# Patient Record
Sex: Male | Born: 1990 | Race: White | Hispanic: No | Marital: Single | Smoking: Current some day smoker
Health system: Southern US, Community
[De-identification: ages and names within clinical notes are randomized; demographics above are authoritative.]

## PROBLEM LIST (undated history)

## (undated) DIAGNOSIS — S14105A Unspecified injury at C5 level of cervical spinal cord, initial encounter: Secondary | ICD-10-CM

## (undated) HISTORY — DX: Unspecified injury at C5 level of cervical spinal cord, initial encounter: S14.105A

---
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
## 2008-04-11 ENCOUNTER — Emergency Department (HOSPITAL_COMMUNITY): Admission: EM | Admit: 2008-04-11 | Discharge: 2008-04-11 | Payer: Self-pay | Admitting: Emergency Medicine

## 2014-07-02 ENCOUNTER — Ambulatory Visit: Payer: 59 | Admitting: Occupational Therapy

## 2014-07-02 ENCOUNTER — Ambulatory Visit: Payer: 59 | Attending: Physical Medicine and Rehabilitation

## 2014-07-02 DIAGNOSIS — M6281 Muscle weakness (generalized): Secondary | ICD-10-CM | POA: Diagnosis not present

## 2014-07-02 DIAGNOSIS — IMO0001 Reserved for inherently not codable concepts without codable children: Secondary | ICD-10-CM | POA: Diagnosis not present

## 2014-07-02 DIAGNOSIS — IMO0002 Reserved for concepts with insufficient information to code with codable children: Secondary | ICD-10-CM | POA: Diagnosis not present

## 2014-07-02 DIAGNOSIS — R279 Unspecified lack of coordination: Secondary | ICD-10-CM | POA: Diagnosis not present

## 2014-07-10 ENCOUNTER — Ambulatory Visit: Payer: 59

## 2014-07-13 ENCOUNTER — Ambulatory Visit: Payer: 59

## 2014-07-18 ENCOUNTER — Ambulatory Visit: Payer: 59

## 2014-07-18 ENCOUNTER — Encounter: Payer: 59 | Admitting: Occupational Therapy

## 2014-07-19 ENCOUNTER — Ambulatory Visit: Payer: 59

## 2014-07-19 ENCOUNTER — Encounter: Payer: 59 | Admitting: Occupational Therapy

## 2014-07-23 ENCOUNTER — Ambulatory Visit: Payer: 59

## 2014-07-23 ENCOUNTER — Encounter: Payer: 59 | Admitting: Occupational Therapy

## 2014-07-25 ENCOUNTER — Encounter: Payer: 59 | Admitting: Occupational Therapy

## 2014-07-25 ENCOUNTER — Ambulatory Visit: Payer: 59

## 2014-07-30 ENCOUNTER — Encounter: Payer: 59 | Admitting: Occupational Therapy

## 2014-07-30 ENCOUNTER — Ambulatory Visit: Payer: 59

## 2014-08-02 ENCOUNTER — Encounter: Payer: 59 | Admitting: Occupational Therapy

## 2014-08-02 ENCOUNTER — Ambulatory Visit: Payer: 59

## 2014-09-17 ENCOUNTER — Ambulatory Visit: Payer: 59 | Admitting: Podiatry

## 2014-09-17 ENCOUNTER — Encounter: Payer: Self-pay | Admitting: Podiatry

## 2014-09-17 VITALS — BP 132/88 | HR 70 | Resp 16 | Ht 67.0 in | Wt 170.0 lb

## 2014-09-17 DIAGNOSIS — L03031 Cellulitis of right toe: Secondary | ICD-10-CM
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
## 2014-09-17 NOTE — Patient Instructions (Signed)
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
## 2014-09-19 ENCOUNTER — Telehealth: Payer: Self-pay | Admitting: *Deleted

## 2014-09-19 ENCOUNTER — Encounter: Payer: Self-pay | Admitting: Podiatry

## 2014-09-19 ENCOUNTER — Ambulatory Visit (INDEPENDENT_AMBULATORY_CARE_PROVIDER_SITE_OTHER): Payer: 59 | Admitting: Podiatry

## 2014-09-19 VITALS — BP 112/68 | HR 74 | Temp 96.5°F | Resp 17

## 2014-09-19 DIAGNOSIS — L03031 Cellulitis of right toe: Secondary | ICD-10-CM

## 2014-09-19 MED ORDER — CEPHALEXIN 500 MG PO CAPS: 500.0000 mg | ORAL_CAPSULE | Freq: Three times a day (TID) | ORAL | Status: AC

## 2014-09-19 NOTE — Telephone Encounter (Signed)
I just need to talk to someone.  He had his toenail clipped down out of an infected toe on Monday.  It looks really bad, it's really swollen, black and blue.  I just want to check with someone because he is a spinal cord injury patient.  I'm not so sure that maybe he needs to be on an antibiotic.  I've got some pictures to send over so you guys can determine if he needs to be seen.  Both his toes also have blisters on them.  I don't know if that was from the freezing or the shots.  Someone please give me a call.  Durene CalHunter is here if you need to talk to him.  Patient's mom had called back and I asked Shanda BumpsJessica S to schedule him an appointment for today.

## 2014-09-19 NOTE — Progress Notes (Signed)
Subjective:     Patient ID: Gregory Deleon, male   DOB: [birth year], 23 y.o.   MRN: [id number]  HPIpatient presents with his parents stating that his right toe has been losing and he has blisters on both big toes and they were just concerned after the ingrown toenail removal. My previous note did have it reflected wrong as the right toe was the one we did the paronychia and the left is the one we did the permanent procedure on   Review of Systems     Objective:   Physical Exam Neurovascular status intact and unchanged with redness in the lateral border right hallux where we remove necrotic tissue secondary to paronychia infection and mild redness in the forefoot that's localized with no other drainage noted or other since of infection. 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 He will check his temperature daily

## 2014-09-24 ENCOUNTER — Encounter: Payer: Self-pay | Admitting: Podiatry

## 2014-09-24 ENCOUNTER — Ambulatory Visit (INDEPENDENT_AMBULATORY_CARE_PROVIDER_SITE_OTHER): Payer: 59

## 2014-09-24 ENCOUNTER — Ambulatory Visit (INDEPENDENT_AMBULATORY_CARE_PROVIDER_SITE_OTHER): Payer: 59 | Admitting: Podiatry

## 2014-09-24 VITALS — BP 141/88 | HR 75 | Resp 16

## 2014-09-24 DIAGNOSIS — R52 Pain, unspecified: Secondary | ICD-10-CM

## 2014-09-24 DIAGNOSIS — L03031 Cellulitis of right toe: Secondary | ICD-10-CM

## 2014-09-24 MED ORDER — LEVOFLOXACIN 500 MG PO TABS: 500.0000 mg | ORAL_TABLET | Freq: Every day | ORAL | Status: AC

## 2014-09-24 MED ORDER — SULFAMETHOXAZOLE-TRIMETHOPRIM 800-160 MG PO TABS
1.0000 | ORAL_TABLET | Freq: Two times a day (BID) | ORAL | Status: AC
Start: 2014-09-24 — End: ?

## 2014-09-25 NOTE — Progress Notes (Signed)
Subjective:     Patient ID: Gregory Deleon, male   DOB: 12-09-1990, 23 y.o.   MRN: [id number]  HPIpatient presents with parents stating that he still having trouble with his right big toe and he's had no fever or any indications of systemic infection   Review of Systems     Objective:   Physical Exam Neurovascular status unchanged with patient who is in a wheelchair and is noted to have drainage right hallux lateral border after having contracted infection is home for which I just cleaned the corner out to try to reduce drainage that was present. It is localized with no proximal edema erythema drainage noted currently    Assessment:     Continued paronychia infection with young man who is in a wheelchair with venous congestion which may be creating pressure on the site. continue to wear surgical shoe    Plan:     Reviewed condition and at this time I switched him over to Levaquin and Augmentin and advised on elevation to try to create more drainage of venous fluid and I also x-rayed which did indicate some soft tissue inflammation but did not indicate any current bone involvement as far as infection. He will be seen back in a week and was given strict instructions to let us know if any other changes should occur

## 2014-10-01 ENCOUNTER — Encounter: Payer: Self-pay | Admitting: Podiatry

## 2014-10-01 ENCOUNTER — Ambulatory Visit (INDEPENDENT_AMBULATORY_CARE_PROVIDER_SITE_OTHER): Payer: 59 | Admitting: Podiatry

## 2014-10-01 DIAGNOSIS — L03031 Cellulitis of right toe: Secondary | ICD-10-CM

## 2014-10-01 MED ORDER — LEVOFLOXACIN 500 MG PO TABS: 500.0000 mg | ORAL_TABLET | Freq: Every day | ORAL | Status: AC

## 2014-10-01 NOTE — Progress Notes (Signed)
Subjective:     Patient ID: Gregory Deleon, male   DOB: July 16, 1991, 23 y.o.   MRN: [id number]  HPI patient presents stating it seems to be doing better but I still have some crusted appearance on my right big toe   Review of Systems     Objective:   Physical Exam Neurovascular status intact with diminished erythema noted of the forefoot bilateral and blisters which of healed on both feet. There is some crusted tissue on the lateral side of the right big toe but it is localized with no proximal edema erythema or drainage noted and show signs of systemic infection    Assessment:     Doing well with gradual healing and what appeared to be a possible Betadine reaction which seems to have settled down with continued healing to occur of the right hallux lateral border where he had had a bad paronychia prior to see me    Plan:     He seems to be improving and everything looks better from a physical standpoint but I've advised on the continuation of soaks air dry and

## 2014-11-12 ENCOUNTER — Ambulatory Visit (INDEPENDENT_AMBULATORY_CARE_PROVIDER_SITE_OTHER): Payer: 59 | Admitting: Podiatry

## 2014-11-12 VITALS — BP 112/68 | HR 87 | Resp 16

## 2014-11-12 DIAGNOSIS — L600 Ingrowing nail: Secondary | ICD-10-CM

## 2014-11-14 NOTE — Progress Notes (Signed)
Subjective:     Patient ID: Gregory Deleon, male   DOB: May 12, 1991, 24 y.o.   MRN: [id number]  HPI patient presents with father stating I want to get my nail checked to make sure there is no current infection   Review of Systems     Objective:   Physical Exam Neurovascular status unchanged with patient in a wheelchair and found to have healing hallux nails right and left with slight discoloration still noted right hallux but no active drainage noted or indications of proximal edema erythema    Assessment:     Patient is doing well with ingrown toenail deformity which is healing with no indication of active infection    Plan:     Reviewed continued soaks and this should heal uneventfully and if any drainage were to start redness or other change to let us know immediately

## 2015-06-25 ENCOUNTER — Ambulatory Visit: Payer: 59 | Attending: Internal Medicine | Admitting: Physical Therapy

## 2015-06-25 DIAGNOSIS — R293 Abnormal posture: Secondary | ICD-10-CM | POA: Diagnosis not present

## 2015-06-26 ENCOUNTER — Encounter: Payer: Self-pay | Admitting: Physical Therapy
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
## 2015-12-25 ENCOUNTER — Ambulatory Visit: Payer: 59 | Admitting: Occupational Therapy

## 2015-12-25 ENCOUNTER — Ambulatory Visit: Payer: 59 | Attending: Internal Medicine | Admitting: Physical Therapy

## 2015-12-25 ENCOUNTER — Encounter: Payer: Self-pay | Admitting: Occupational Therapy

## 2015-12-25 ENCOUNTER — Encounter: Payer: Self-pay | Admitting: Physical Therapy

## 2015-12-25 DIAGNOSIS — R293 Abnormal posture: Secondary | ICD-10-CM | POA: Insufficient documentation

## 2015-12-25 DIAGNOSIS — Z741 Need for assistance with personal care: Secondary | ICD-10-CM

## 2015-12-25 DIAGNOSIS — R531 Weakness: Secondary | ICD-10-CM | POA: Insufficient documentation

## 2015-12-25 DIAGNOSIS — R2689 Other abnormalities of gait and mobility: Secondary | ICD-10-CM | POA: Diagnosis present

## 2015-12-25 DIAGNOSIS — R279 Unspecified lack of coordination: Secondary | ICD-10-CM | POA: Insufficient documentation

## 2015-12-25 DIAGNOSIS — R460 Very low level of personal hygiene: Secondary | ICD-10-CM | POA: Diagnosis present

## 2015-12-25 DIAGNOSIS — R278 Other lack of coordination: Secondary | ICD-10-CM | POA: Diagnosis present

## 2015-12-26 ENCOUNTER — Encounter: Payer: Self-pay | Admitting: Physical Therapy

## 2015-12-26 NOTE — Therapy (Signed)
[place] [facility] MAIN [facility] SERVICES [address] [place], [place], [address] Phone: [phone]   Fax:  [phone]  Physical Therapy Evaluation  Patient Details  Name: Gregory Deleon MRN: [id number] Date of Birth: 03-20-1991 Referring Provider: Martha Clan MD  Encounter Date: 12/25/2015      PT End of Session - 12/26/15 0920    Visit Number 1   Number of Visits 17   Date for PT Re-Evaluation 02/20/16   PT Start Time 0830   PT Stop Time 0920   PT Time Calculation (min) 50 min   Activity Tolerance Patient tolerated treatment well   Behavior During Therapy [facility] for tasks assessed/performed      Past Medical History  Diagnosis Date  . Spinal cord injury, C5-C7 (HCC)     incomplete    History reviewed. No pertinent past surgical history.  There were no vitals filed for this visit.  Visit Diagnosis:  Posture abnormality - Plan: PT plan of care cert/re-cert  Weakness - Plan: PT plan of care cert/re-cert  Impaired gait and mobility - Plan: PT plan of care cert/re-cert      Subjective Assessment - 12/25/15 0829    Subjective 25 yo Male s/p C5-C7 incomplete SCI on February 10, 2014; Patient is s/p recent PT treatment at [facility] for 5 weeks in Fall 2016; He reports coming back to PT to work on higher level transfers, trying to work on fine motor dexterity in hands; He reports wanting to learn how to do a floor transfer; He reports getting back into chair 1x but it was very difficult; He reports that he really wants to be more independent; Has intact light touch sensation in BLE except upper thighs and intact deep pressure; He reports increased nerve pain in left hand; He has a standing a frame that he uses 1-2 times a week; He will stand for about 30 min at a time; does scoot transfers pushing through arms; Currently going to gym doing weights/back exercise; doing shoulder exercise;    Pertinent History personal factors affecting  rehab: severity of injury   How long can you sit comfortably? NA, all day in wheelchair   How long can you stand comfortably? NA   How long can you walk comfortably? NA   Patient Stated Goals Work on floor transfers, hand dexterity;    Currently in Pain? Yes   Pain Score 3    Pain Location Hand   Pain Orientation Left   Pain Descriptors / Indicators Burning   Pain Type Chronic pain   Pain Frequency Constant   Pain Relieving Factors nothing relieves pain;             [facility] PT Assessment - 12/26/15 0001    Assessment   Medical Diagnosis s/p C5-C7 incomplete SCI   Onset Date/Surgical Date 02/10/14   Hand Dominance Right   Next MD Visit few months   Prior Therapy Had PT eval in Aug 2016 for power wheelchair seating; has had multiple bouts of PT since SCI; none recent   Precautions   Precautions Fall   Required Braces or Orthoses --  night splints for hands; doesn't wear them   Restrictions   Weight Bearing Restrictions No   Balance Screen   Has the patient fallen in the past 6 months No   Has the patient had a decrease in activity level because of a fear of falling?  No   Is the patient reluctant to leave  their home because of a fear of falling?  No   Home Environment   Additional Comments lives in single story home, no stairs; ramp; able to negotiate small curb; has trouble getting up the curbs; goes down curbs backwards;    Prior Function   Level of Independence Independent with basic ADLs;Independent with transfers  needs help with high transfers;    Vocation --  working on getting license; going to go back to school    Leisure wheelchair rugby Aeronautical engineer); likes to [employer];    Cognition   Overall Cognitive Status Within Functional Limits for tasks assessed   Observation/Other Assessments   Skin Integrity intact grossly   Sensation   Additional Comments impaired light touch sensation for right lateral thigh; intact deep pressure throughout BLE;    Coordination    Gross Motor Movements are Fluid and Coordinated --  yes for BUE; no for BLE;    Posture/Postural Control   Posture Comments sits with mild slumped posture with decreased lumbar lordosis; able to self correct with verbal cues; does have increased scapular winging; able to demonstrate good shoulder protraction with cues;    Tone   Assessment Location Right Lower Extremity;Left Lower Extremity   AROM   Overall AROM Comments BUE AROM is WFL (able to reach behind head and behind back); able to initiate RLE hip flexion in supine, and hip adduction; otherwise no LE AROM; PROM: SLR approximately 60 degrees without pain;    Strength   Overall Strength Comments BUE shoulder/elbow strength is WFL; upper back strength 3/5; hip flexion RLE: 2/5, LLE: 0/5; knee and ankle 0/5   Palpation   Palpation comment denies any tenderness to palpation;    Bed Mobility   Rolling Right 5: Supervision  reports being indep at home; requires assist on narrow mat   Rolling Right Details (indicate cue type and reason) able to initiate UE movement to pull self onto side;   Rolling Left 5: Supervision   Rolling Left Details (indicate cue type and reason) had difficulty on narrow mat table requiring min-mod A but reports being independent at home   Supine to Sit 6: Modified independent (Device/Increase time)  rolls to side and pushes through arms; requires bed rails   Sit to Supine 6: Modified independent (Device/Increase time)  lifts LLE with UE and then tone lifts RLE to get into bed;   Transfers   Comments does a scoot transfer from chair<>mat table, independent with BUE push up. doesn't use a slide board anymore;    Ambulation/Gait   Gait Comments unable to ambulate at this time;    Counsellor --  independent with Nurse, learning disability Both upper extremities   Wheelchair Parts Management Independent   Distance able to propel on  even/uneven surfaces; mod I with curbs (low in height) does have difficulty with taller curbs due to spasticity in BLE and falling out of chair;    High Level Balance   High Level Balance Comments able to sit unsupported with feet on floor arms across chest, eyes closed no sway, against moderate pertubations;    RLE Tone   RLE Tone Hypertonic   RLE Tone   Hypertonic Details increase extensor tone, moderate   LLE Tone   LLE Tone Hypertonic;Moderate   LLE Tone   Hypertonic Details extensor tone        TREATMENT: PT initiated HEP with diaphragmatic breathing with posterior pelvic  tilt 5 sec hold x10 reps; Posterior pelvic tilt with hip flexion x5 each LE (able to initiate RLE hip flexion but demonstrates decreased LLE hip flexion;  Patient required min-moderate verbal/tactile cues for correct exercise technique and to increase core abdominal stabilization with LE movement                    PT Education - 12/26/15 0919    Education provided Yes   Education Details HEP, diaphgragmatic breathing with posterior pelvic tilt   Person(s) Educated Patient   Methods Explanation;Verbal cues;Demonstration   Comprehension Verbalized understanding;Returned demonstration;Verbal cues required             PT Long Term Goals - 12/26/15 1610    PT LONG TERM GOAL #1   Title Patient will be independent in home exercise program to improve strength/mobility for better functional independence with ADLs. by 02/20/16   Time 8   Period Weeks   Status New   PT LONG TERM GOAL #2   Title Patient will be mod I for floor to chair transfers to improve mobility at home by 02/20/16   Time 8   Period Weeks   Status New   PT LONG TERM GOAL #3   Title Patient will demonstrate improved upper back/core abdominal strength to 4/5 for increased trunk control with ADLs by 02/20/16   Time 8   Period Weeks   Status New               Plan - 12/26/15 0921    Clinical Impression Statement  25 yo Male s/p C5-C7 incomplete spinal cord injury in April 2015. Patient has received extensive rehab for mobility. He is independent in scoot transfers and independent with wheelchair mobility. He reports wanting to come to PT to work on floor transfers to improve independence in getting in/out of chair in case of a fall. 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 He is hoping to either go back to school or return to work tasks. He would benefit from skilled PT intervention to improve mobility for increased independence with ADLs.    Pt will benefit from skilled therapeutic intervention in order to improve on the following deficits Decreased endurance;Hypomobility;Impaired tone;Decreased activity tolerance;Decreased strength;Pain;Impaired UE functional use;Decreased mobility;Improper body mechanics;Postural dysfunction;Decreased safety awareness   Rehab Potential Good   Clinical Impairments Affecting Rehab Potential positive: motivated, young in age; negative: severity of injury; Patient's clinical presentation is stable.    PT Frequency 2x / week   PT Duration 8 weeks   PT Treatment/Interventions ADLs/Self Care Home Management;Cryotherapy;Electrical Stimulation;Moist Heat;Balance training;Therapeutic exercise;Therapeutic activities;Functional mobility training;DME Instruction;Neuromuscular re-education;Patient/family education;Wheelchair mobility training;Taping;Energy conservation   PT Next Visit Plan work on floor transfers   PT Home Exercise Plan initiated   Consulted and Agree with Plan of Care Patient         Problem List There are no active problems to display for this patient.   Trotter,Margaret PT, DPT 12/26/2015, 9:30 AM  [place] [facility] MAIN [facility] SERVICES [address] [place], [place], [address] Phone: [phone]   Fax:  [phone]  Name: Gregory Deleon MRN: [id number] Date of Birth: 1990/12/10
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
## 2015-12-30 ENCOUNTER — Ambulatory Visit: Payer: 59 | Admitting: Occupational Therapy

## 2015-12-30 DIAGNOSIS — R278 Other lack of coordination: Secondary | ICD-10-CM

## 2015-12-30 DIAGNOSIS — R460 Very low level of personal hygiene: Secondary | ICD-10-CM

## 2015-12-30 DIAGNOSIS — R293 Abnormal posture: Secondary | ICD-10-CM | POA: Diagnosis not present

## 2015-12-30 DIAGNOSIS — R531 Weakness: Secondary | ICD-10-CM

## 2015-12-30 DIAGNOSIS — Z741 Need for assistance with personal care: Secondary | ICD-10-CM

## 2015-12-31 ENCOUNTER — Encounter: Payer: Self-pay | Admitting: Occupational Therapy
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
## 2016-01-01 ENCOUNTER — Ambulatory Visit: Payer: 59 | Admitting: Physical Therapy

## 2016-01-01 ENCOUNTER — Encounter: Payer: Self-pay | Admitting: Physical Therapy

## 2016-01-01 ENCOUNTER — Ambulatory Visit: Payer: 59 | Admitting: Occupational Therapy

## 2016-01-01 DIAGNOSIS — R293 Abnormal posture: Secondary | ICD-10-CM

## 2016-01-01 DIAGNOSIS — R279 Unspecified lack of coordination: Secondary | ICD-10-CM

## 2016-01-01 DIAGNOSIS — R2689 Other abnormalities of gait and mobility: Secondary | ICD-10-CM

## 2016-01-01 DIAGNOSIS — R531 Weakness: Secondary | ICD-10-CM
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
## 2016-01-02 ENCOUNTER — Encounter: Payer: Self-pay | Admitting: Physical Therapy

## 2016-01-02 NOTE — Therapy (Signed)
[facility] MAIN [facility] SERVICES [address] [place], [place], [address] Phone: [phone]   Fax:  [phone]  Physical Therapy Treatment  Patient Details  Name: Gregory Deleon MRN: [id number] Date of Birth: Dec 27, 1990 Referring Provider: Martha Clan MD  Encounter Date: 01/01/2016      PT End of Session - 01/02/16 0833    Visit Number 2   Number of Visits 17   Date for PT Re-Evaluation 02/20/16   PT Start Time 1300   PT Stop Time 1345   PT Time Calculation (min) 45 min   Activity Tolerance Patient tolerated treatment well   Behavior During Therapy [facility] for tasks assessed/performed      Past Medical History  Diagnosis Date  . Spinal cord injury, C5-C7 (HCC)     incomplete    History reviewed. No pertinent past surgical history.  There were no vitals filed for this visit.  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 NA, all day in wheelchair   How long can you stand comfortably? NA   How long can you walk comfortably? NA   Patient Stated Goals Work on floor transfers, hand dexterity;    Currently in Pain? No/denies       TREATMENT: PT educated patient in BUE strengthening: Seated tricep press 5 sec hold x8 reps; patient required close supervision for trunk control for better positioning and control;    Scoot transfers wheelchair<>mat x2 reps each;  Prone on elbows: Serratus punch x15; Scooting arms forward/backward x5 each;  Qped: Patient required mod-max A x1 for getting into qped position with blocking feet for increased knee/hip  flexion; Push ups x3 reps; Qped over pball: Shoulder extension 5# x5 reps; Shoulder horizontal abduction 5# x3 reps;  Patient fatigues quickly in qped position and required mod VCs for correct shoulder position with weights; He has difficulty maintaining LUE elbow extension due to weakness in tricep;  Attempted prone on elbows press up onto hands x2 reps; Patient has difficulty shifting weight from one UE to another due to weakness;  PT educated patient in gym exercise including to add tricep press with rope with higher weight and less reps for increased strength; Patient very fatigued after treatment session;                         PT Education - 01/02/16 0832    Education provided Yes   Education Details upper body strengthening, posture/positioning;   Person(s) Educated Patient   Methods Explanation;Verbal cues;Demonstration;Tactile cues   Comprehension Verbalized understanding;Returned demonstration;Verbal cues required;Tactile cues required             PT Long Term Goals - 12/26/15 0927    PT LONG TERM GOAL #1   Title Patient will be independent in home exercise program to improve strength/mobility for better functional independence with ADLs. by 02/20/16   Time 8   Period Weeks   Status New   PT LONG TERM GOAL #2   Title Patient will be mod I for floor to chair transfers to improve mobility at  home by 02/20/16   Time 8   Period Weeks   Status New   PT LONG TERM GOAL #3   Title Patient will demonstrate improved upper back/core abdominal strength to 4/5 for increased trunk control with ADLs by 02/20/16   Time 8   Period Weeks   Status New               Plan - 01/02/16 1610    Clinical Impression Statement Instructed patient in upper body strengthening. He had increased fatigue with lifting body due to weakness. Patient also exhibits increased left tricep weakness as compared to right; Patient required mod VCS for correct positioning and to  increase scapular control with advanced exercise. He would benefit from additional skilled PT intervention to improve upper body strength for floor transfers;    Pt will benefit from skilled therapeutic intervention in order to improve on the following deficits Decreased endurance;Hypomobility;Impaired tone;Decreased activity tolerance;Decreased strength;Pain;Impaired UE functional use;Decreased mobility;Improper body mechanics;Postural dysfunction;Decreased safety awareness   Rehab Potential Good   Clinical Impairments Affecting Rehab Potential positive: motivated, young in age; negative: severity of injury; Patient's clinical presentation is stable.    PT Frequency 2x / week   PT Duration 8 weeks   PT Treatment/Interventions ADLs/Self Care Home Management;Cryotherapy;Electrical Stimulation;Moist Heat;Balance training;Therapeutic exercise;Therapeutic activities;Functional mobility training;DME Instruction;Neuromuscular re-education;Patient/family education;Wheelchair mobility training;Taping;Energy conservation   PT Next Visit Plan work on floor transfers   PT Home Exercise Plan educated patient in advancing upper body strengthening with weights at gym;    Consulted and Agree with Plan of Care Patient        Problem List There are no active problems to display for this patient.   Trotter,Margaret PT, DPT 01/02/2016, 8:39 AM  [facility] MAIN [facility] SERVICES [address] [place], [place], [address] Phone: [phone]   Fax:  [phone]  Name: Gregory Deleon MRN: [id number] Date of Birth: [birth year]

## 2016-01-06 ENCOUNTER — Ambulatory Visit: Payer: 59 | Admitting: Physical Therapy

## 2016-01-06 ENCOUNTER — Encounter: Payer: 59 | Admitting: Occupational Therapy

## 2016-01-08 ENCOUNTER — Ambulatory Visit: Payer: 59 | Admitting: Occupational Therapy

## 2016-01-08 ENCOUNTER — Encounter: Payer: Self-pay | Admitting: Physical Therapy

## 2016-01-08 ENCOUNTER — Ambulatory Visit: Payer: 59 | Attending: Internal Medicine | Admitting: Physical Therapy

## 2016-01-08 DIAGNOSIS — R2689 Other abnormalities of gait and mobility: Secondary | ICD-10-CM

## 2016-01-08 DIAGNOSIS — R278 Other lack of coordination: Secondary | ICD-10-CM | POA: Insufficient documentation

## 2016-01-08 DIAGNOSIS — R531 Weakness: Secondary | ICD-10-CM | POA: Diagnosis present

## 2016-01-08 DIAGNOSIS — R293 Abnormal posture: Secondary | ICD-10-CM | POA: Diagnosis present

## 2016-01-08 DIAGNOSIS — R460 Very low level of personal hygiene: Secondary | ICD-10-CM | POA: Insufficient documentation

## 2016-01-08 DIAGNOSIS — R279 Unspecified lack of coordination: Secondary | ICD-10-CM | POA: Insufficient documentation

## 2016-01-08 NOTE — Patient Instructions (Signed)
Pt. continues to work on improving BUE strength, and developing the skills needed for hand function. Pt. continues to fatigue with strengthening exercises, however is presenting with active muscle responses in the triceps. He worked on improving active lateral and 2pt.pinch for 1" objects. Pt. continues to require skilled OT services to improve UE strength and coordination for ADL and IADL tasks. Pt. performed 2# dowel ex. For UE strengthening secondary to weakness. Bilateral shoulder flexion, chest press, circular patterns, and elbow flexion/extension were performed, with ace wrap in place on the left hand. Pt. supination/pronation with a hammer. Support and cues were required for the left hand. The hammer was graded down to a wooden dowel for the left hand secondary to fatigue. Pt. performed reps  on a soft foam ball followed by a yellow resistive clip using left lateral and 2 pt. pinch.  Pt. worked on grasping 1" and 1/2" objects with his left hand. Pt. requires cues to grasp and place the objects into a container.
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
## 2016-01-08 NOTE — Therapy (Signed)
[facility] MAIN [facility] SERVICES [address] [place], [place], [address] Phone: [phone]   Fax:  [phone]  Physical Therapy Treatment  Patient Details  Name: Gregory Deleon MRN: [id number] Date of Birth: 03-11-1991 Referring Provider: Martha Clan MD  Encounter Date: 01/08/2016      PT End of Session - 01/08/16 1504    Visit Number 3   Number of Visits 17   Date for PT Re-Evaluation 02/20/16   PT Start Time 1300   PT Stop Time 1345   PT Time Calculation (min) 45 min   Equipment Utilized During Treatment Gait belt   Activity Tolerance Patient tolerated treatment well   Behavior During Therapy [facility] for tasks assessed/performed      Past Medical History  Diagnosis Date  . Spinal cord injury, C5-C7 (HCC)     incomplete    History reviewed. No pertinent past surgical history.  There were no vitals filed for this visit.  Visit Diagnosis:  Weakness  Posture abnormality  Impaired gait and mobility      Subjective Assessment - 01/08/16 1503    Subjective Patient reports doing well. 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 hasn't been lifting as many weights as he is getting ready for the tournament and has had increased practices.   Pertinent History personal factors affecting rehab: severity of injury   How long can you sit comfortably? NA, all day in wheelchair   How long can you stand comfortably? NA   How long can you walk comfortably? NA   Patient Stated Goals Work on floor transfers, hand dexterity;    Currently in Pain? No/denies      TREATMENT: PT instructed patient in floor transfers: He was mod A for transferring from wheelchair to floor with cues for hand placement x1 rep; Attempted floor to wheelchair transfer with body on right side of chair due to increased RUE tricep strength x1 rep; Patient had difficulty lifting hips up into transfer; Attempted floor to wheelchair transfer with body to left side  of chair with RUE pushing on chair and LUE pushing on floor x2 reps, max A +1 with patient getting very close to getting hips into wheelchair; Patient able to initiate transfer but then has difficulty scooting hips to right side to get into chair; Patient was then transferred to chair with chair on back, mod A +3 and then picking chair up;  Patient continues to have weakness in BUE and fatigued quickly with transfers; He required increased assistance for positioning LE and demonstrates increased tone in LLE during transfer attempts;                           PT Education - 01/08/16 1504    Education provided Yes   Education Details transfers, positioning   Person(s) Educated Patient   Methods Explanation;Verbal cues   Comprehension Returned demonstration;Verbal cues required;Verbalized understanding             PT Long Term Goals - 12/26/15 0927    PT LONG TERM GOAL #1   Title Patient will be independent in home exercise program to improve strength/mobility for better functional independence with ADLs. by 02/20/16   Time 8   Period Weeks   Status New   PT LONG TERM GOAL #2   Title Patient will be mod I for floor to chair transfers to improve mobility at home by 02/20/16   Time 8  Period Weeks   Status New   PT LONG TERM GOAL #3   Title Patient will demonstrate improved upper back/core abdominal strength to 4/5 for increased trunk control with ADLs by 02/20/16   Time 8   Period Weeks   Status New               Plan - 01/08/16 1504    Clinical Impression Statement Instructed patient in floor transfers. Patient attempted floor to wheelchair transfer x3 reps with max A +1 not being able to get into chair. PT assisted patient with positioning legs/wheelchair and provided cues for hand and body placement; patient was max A +3 for getting back into chair at end of  PT session; He would benefit from additional skilled PT intervention to improve UE strength  for better functional mobility;    Pt will benefit from skilled therapeutic intervention in order to improve on the following deficits Decreased endurance;Hypomobility;Impaired tone;Decreased activity tolerance;Decreased strength;Pain;Impaired UE functional use;Decreased mobility;Improper body mechanics;Postural dysfunction;Decreased safety awareness   Rehab Potential Good   Clinical Impairments Affecting Rehab Potential positive: motivated, young in age; negative: severity of injury; Patient's clinical presentation is stable.    PT Frequency 2x / week   PT Duration 8 weeks   PT Treatment/Interventions ADLs/Self Care Home Management;Cryotherapy;Electrical Stimulation;Moist Heat;Balance training;Therapeutic exercise;Therapeutic activities;Functional mobility training;DME Instruction;Neuromuscular re-education;Patient/family education;Wheelchair mobility training;Taping;Energy conservation   PT Next Visit Plan work on floor transfers, work on Armed forces technical officer, work on UE strengthening   PT Home Exercise Plan continue as previously given   Consulted and Agree with Plan of Care Patient        Problem List There are no active problems to display for this patient.   Chandell Attridge PT, DPT 01/08/2016, 3:06 PM  [facility] MAIN [facility] SERVICES [address] [place], [place], [address] Phone: [phone]   Fax:  [phone]  Name: MELINDA POTTINGER MRN: [id number] Date of Birth: 1991-07-06

## 2016-01-09 ENCOUNTER — Other Ambulatory Visit: Payer: Self-pay | Admitting: Physical Medicine and Rehabilitation

## 2016-01-09 DIAGNOSIS — M8580 Other specified disorders of bone density and structure, unspecified site: Secondary | ICD-10-CM

## 2016-01-09 DIAGNOSIS — G8254 Quadriplegia, C5-C7 incomplete: Secondary | ICD-10-CM

## 2016-01-14 ENCOUNTER — Ambulatory Visit: Payer: 59 | Admitting: Occupational Therapy

## 2016-01-14 DIAGNOSIS — R460 Very low level of personal hygiene: Secondary | ICD-10-CM

## 2016-01-14 DIAGNOSIS — Z741 Need for assistance with personal care: Secondary | ICD-10-CM

## 2016-01-14 DIAGNOSIS — R279 Unspecified lack of coordination: Secondary | ICD-10-CM

## 2016-01-14 DIAGNOSIS — R531 Weakness: Secondary | ICD-10-CM | POA: Diagnosis not present

## 2016-01-15 ENCOUNTER — Encounter: Payer: Self-pay | Admitting: Occupational Therapy
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
## 2016-01-16 ENCOUNTER — Encounter: Payer: Self-pay | Admitting: Physical Therapy

## 2016-01-16 ENCOUNTER — Ambulatory Visit: Payer: 59 | Admitting: Occupational Therapy

## 2016-01-16 ENCOUNTER — Ambulatory Visit: Payer: 59 | Admitting: Physical Therapy

## 2016-01-16 DIAGNOSIS — R279 Unspecified lack of coordination: Secondary | ICD-10-CM

## 2016-01-16 DIAGNOSIS — R531 Weakness: Secondary | ICD-10-CM | POA: Diagnosis not present

## 2016-01-16 DIAGNOSIS — R2689 Other abnormalities of gait and mobility: Secondary | ICD-10-CM

## 2016-01-16 DIAGNOSIS — R293 Abnormal posture: Secondary | ICD-10-CM

## 2016-01-16 NOTE — Therapy (Signed)
[facility] MAIN [facility] SERVICES [address] [place], [place], [address] Phone: [phone]   Fax:  [phone]  Physical Therapy Treatment  Patient Details  Name: Gregory Deleon MRN: [id number] Date of Birth: [birth year] Referring Provider: Martha Clan MD  Encounter Date: 01/16/2016      PT End of Session - 01/16/16 1406    Visit Number 4   Number of Visits 17   Date for PT Re-Evaluation 02/20/16   PT Start Time 1302   PT Stop Time 1342   PT Time Calculation (min) 40 min   Equipment Utilized During Treatment Gait belt   Activity Tolerance Patient tolerated treatment well   Behavior During Therapy [facility] for tasks assessed/performed      Past Medical History  Diagnosis Date  . Spinal cord injury, C5-C7 (HCC)     incomplete    History reviewed. No pertinent past surgical history.  There were no vitals filed for this visit.  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 No new falls; reports that he does free weights at the gym with exercise;    Pertinent History personal factors affecting rehab: severity of injury   How long can you sit comfortably? NA, all day in wheelchair   How long can you stand comfortably? NA   How long can you walk comfortably? NA   Patient Stated Goals Work on floor transfers, hand dexterity;    Currently in Pain? No/denies      TREATMENT:  PT instructed patient in floor transfer with patient trying to push with RUE and get into sitting position; Patient unable to keep body in chair when trying to sit up;  PT instructed patient in BUE strengthening: Matrix: UE D1 flexion 7.5# x10 bilaterally; UE D1 extension 2.5# x10 bilaterally; UE D2 extension 2.5# x10 bilaterally;  Prone with 5# in BUE: Mid row x15, bilaterally; Shoulder extension  x15 with RUE only; Low row x15 bilaterally; Shoulder flexion x12 bilaterally; Shoulder horizontal abduction x10 bilaterally;  Qped on elbows, scapular protraction 2x10  Patient requires mod A for getting into qped position;  Patient exhibits increased clonus in BLE today as compared to previous session;  Patient required min-moderate verbal/tactile cues for correct exercise technique for all exercise. He was able to increase resistance and repetition with minimal increase in fatigue;                          PT Education - 01/16/16 1406    Education provided Yes   Education Details upper body strengthening   Person(s) Educated Patient   Methods Explanation;Verbal cues   Comprehension Verbalized understanding;Returned demonstration;Verbal cues required             PT Long Term Goals - 12/26/15 0927    PT LONG TERM GOAL #1   Title Patient will be independent in home exercise program to improve strength/mobility for better functional independence with ADLs. by 02/20/16   Time 8   Period Weeks   Status New   PT LONG TERM GOAL #2   Title Patient will be mod I for floor to chair transfers to improve mobility at home by 02/20/16   Time 8   Period Weeks   Status New   PT LONG TERM GOAL #3  Title Patient will demonstrate improved upper back/core abdominal strength to 4/5 for increased trunk control with ADLs by 02/20/16   Time 8   Period Weeks   Status New               Plan - 01/16/16 1408    Clinical Impression Statement Instructed patient in BUE strengthening exercise working on scapular and glenohumeral strengthening. Patient has difficulty with gripping weights. Applied cuff weights to LUE for better lifting technique. Patient attemped floor transfer in wheelchair but was unable to hold self in chair requiring +2 assist for getting back up in sitting. Patient would benefit from additional skilled PT intervention to improve UE strength for floor  transfers;    Pt will benefit from skilled therapeutic intervention in order to improve on the following deficits Decreased endurance;Hypomobility;Impaired tone;Decreased activity tolerance;Decreased strength;Pain;Impaired UE functional use;Decreased mobility;Improper body mechanics;Postural dysfunction;Decreased safety awareness   Rehab Potential Good   Clinical Impairments Affecting Rehab Potential positive: motivated, young in age; negative: severity of injury; Patient's clinical presentation is stable.    PT Frequency 2x / week   PT Duration 8 weeks   PT Treatment/Interventions ADLs/Self Care Home Management;Cryotherapy;Electrical Stimulation;Moist Heat;Balance training;Therapeutic exercise;Therapeutic activities;Functional mobility training;DME Instruction;Neuromuscular re-education;Patient/family education;Wheelchair mobility training;Taping;Energy conservation   PT Next Visit Plan work on floor transfers, work on Armed forces technical officertipping wheelchair, work on UE strengthening   PT Home Exercise Plan continue as previously given   Consulted and Agree with Plan of Care Patient        Problem List There are no active problems to display for this patient.   Pressley Barsky PT, DPT 01/16/2016, 2:26 PM  [facility] [address] [place], [place], [address] Phone: [phone]   Fax:  [phone]  Name: Sampson GoonHunter C Nylund MRN: [id number] Date of Birth: 21-Sep-1991
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
## 2016-01-17 ENCOUNTER — Ambulatory Visit: Payer: 59 | Admitting: Physical Therapy

## 2016-01-17 ENCOUNTER — Encounter: Payer: 59 | Admitting: Occupational Therapy

## 2016-01-20 ENCOUNTER — Ambulatory Visit: Payer: 59

## 2016-01-20 ENCOUNTER — Ambulatory Visit: Payer: 59 | Admitting: Occupational Therapy

## 2016-01-20 ENCOUNTER — Encounter: Payer: Self-pay | Admitting: Occupational Therapy

## 2016-01-20 DIAGNOSIS — R279 Unspecified lack of coordination: Secondary | ICD-10-CM

## 2016-01-20 DIAGNOSIS — Z741 Need for assistance with personal care: Secondary | ICD-10-CM

## 2016-01-20 DIAGNOSIS — R460 Very low level of personal hygiene: Secondary | ICD-10-CM

## 2016-01-20 DIAGNOSIS — R531 Weakness: Secondary | ICD-10-CM | POA: Diagnosis not present

## 2016-01-20 NOTE — Therapy (Signed)
[place] [facility] MAIN [facility] SERVICES [address] [place], [place], [address] Phone: [phone]   Fax:  [phone]  Physical Therapy Treatment  Patient Details  Name: Gregory Deleon MRN: [id number] Date of Birth: 10-31-1991 Referring Provider: Martha Clan MD  Encounter Date: 01/20/2016      PT End of Session - 01/20/16 1202    Visit Number 5   Number of Visits 17   Date for PT Re-Evaluation 02/20/16   PT Start Time 1105   PT Stop Time 1150   PT Time Calculation (min) 45 min   Equipment Utilized During Treatment Gait belt   Activity Tolerance Patient tolerated treatment well   Behavior During Therapy [facility] for tasks assessed/performed      Past Medical History  Diagnosis Date  . Spinal cord injury, C5-C7 (HCC)     incomplete    History reviewed. No pertinent past surgical history.  There were no vitals filed for this visit.  Visit Diagnosis:  Weakness generalized  Lack of coordination      Subjective Assessment - 01/20/16 1107    Subjective Pt reports he is doing well today. No pain reported. He is performing HEP without issue and continues to participate with wheelchair rugby in [place]. No specific questions or concerns at this time.    Pertinent History personal factors affecting rehab: severity of injury   How long can you sit comfortably? NA, all day in wheelchair   How long can you stand comfortably? NA   How long can you walk comfortably? NA   Patient Stated Goals Work on floor transfers, hand dexterity;    Currently in Pain? Yes   Pain Score 5    Pain Location Hand   Pain Orientation Left   Pain Descriptors / Indicators Burning   Pain Type Chronic pain   Pain Onset More than a month ago   Pain Frequency Constant        TREATMENT:  PT instructed patient in BUE strengthening: Matrix: UE D1 extension 7.5# RUE, 2.5# LUE  2 x 10  Lat pull downs 2.5, 7.5, 12.5# x 10 each bilateral, used ankle strap for  LUE;  Prone with 5# in BUE: Mid row x15, bilaterally;  5# RUE, 2# LUE: Shoulder extension x 10l Shoulder flexion x10;  Supine: Scapular protraction with bent elbows and bodyweight of therapist 2 x 10; unable to get into prone positiong today due to increased tone in LEs on this date; Scapular protraction with upper trunk rotation with manual resistance at shoulder and then elbow 2 x 10 bilateral; Shoulder extension into mat table with trunk raise 2 x 10;  Seated: Seated tricep press up x 10;  Patient required min-moderate verbal/tactile cues for correct exercise technique for all exercise. Minimal fatigue reported today. Pt takes increased time for positioning.                            PT Education - 01/20/16 1201    Education provided Yes   Education Details Reinforced HEP   Person(s) Educated Patient   Methods Explanation   Comprehension Verbalized understanding             PT Long Term Goals - 12/26/15 0927    PT LONG TERM GOAL #1   Title Patient will be independent in home exercise program to improve strength/mobility for better functional independence with ADLs. by 02/20/16   Time 8   Period Tania Ade  Status New   PT LONG TERM GOAL #2   Title Patient will be mod I for floor to chair transfers to improve mobility at home by 02/20/16   Time 8   Period Weeks   Status New   PT LONG TERM GOAL #3   Title Patient will demonstrate improved upper back/core abdominal strength to 4/5 for increased trunk control with ADLs by 02/20/16   Time 8   Period Weeks   Status New               Plan - 01/20/16 1202    Clinical Impression Statement Pt demonstrates increased LE tone on this date making bed mobility difficulty with positioning on mat table. Pt unable to get into quadriped position due to increased LE tone. He is able to perform all exercises as instructed requiring adaptive equipment for LUE due to decreased strength. Pt encouraged to  continue HEP and follow-up as scheduled.    Pt will benefit from skilled therapeutic intervention in order to improve on the following deficits Decreased endurance;Hypomobility;Impaired tone;Decreased activity tolerance;Decreased strength;Pain;Impaired UE functional use;Decreased mobility;Improper body mechanics;Postural dysfunction;Decreased safety awareness   Rehab Potential Good   Clinical Impairments Affecting Rehab Potential positive: motivated, young in age; negative: severity of injury; Patient's clinical presentation is stable.    PT Frequency 2x / week   PT Duration 8 weeks   PT Treatment/Interventions ADLs/Self Care Home Management;Cryotherapy;Electrical Stimulation;Moist Heat;Balance training;Therapeutic exercise;Therapeutic activities;Functional mobility training;DME Instruction;Neuromuscular re-education;Patient/family education;Wheelchair mobility training;Taping;Energy conservation   PT Next Visit Plan work on floor transfers, work on Armed forces technical officertipping wheelchair, work on UE strengthening   PT Home Exercise Plan continue as previously given   Consulted and Agree with Plan of Care Patient        Problem List There are no active problems to display for this patient.  Lynnea MaizesJason D Aceyn Kathol PT, DPT   Baneza Bartoszek 01/20/2016, 12:11 PM  [place] [facility] MAIN [facility] [address] [place], [place], [address] Phone: [phone]   Fax:  [phone]  Name: Gregory Deleon MRN: [id number] Date of Birth: 10-31-1991

## 2016-01-21 NOTE — Therapy (Signed)
[place] [facility] MAIN [facility] SERVICES [address] [place], [place], [address] Phone: [phone]   Fax:  [phone]  Occupational Therapy Treatment  Patient Details  Name: Gregory Deleon MRN: [id number] Date of Birth: 18-May-1991 Referring Provider: Clelia Croft  Encounter Date: 01/20/2016      OT End of Session - 01/21/16 1840    Visit Number 7   Number of Visits 16   Date for OT Re-Evaluation 02/19/16   OT Start Time 1015   OT Stop Time 1100   OT Time Calculation (min) 45 min   Activity Tolerance Patient tolerated treatment well   Behavior During Therapy [facility] for tasks assessed/performed      Past Medical History  Diagnosis Date  . Spinal cord injury, C5-C7 (HCC)     incomplete    History reviewed. No pertinent past surgical history.  There were no vitals filed for this visit.  Visit Diagnosis:  Lack of coordination  Weakness generalized  Self-care deficit for dressing and grooming                    OT Treatments/Exercises (OP) - 01/21/16 1847    ADLs   Writing Patient seen for pen grasp for handwriting, 2 pens utilized, regular grip and larger triangle grip.  Improved legibility with increased trials as well as triangle grip.     Fine Motor Coordination   Other Fine Motor Exercises Patient seen for facilitation of oppositional grasp of items with thumb and index and thumb and middle finger for multiple reps and sets with cues and some manual rotation of thumb once patient fatigued.                 OT Education - 01/21/16 1840    Education provided Yes   Education Details HEP   Person(s) Educated Patient   Methods Explanation;Demonstration;Verbal cues   Comprehension Verbal cues required;Returned demonstration;Verbalized understanding             OT Long Term Goals - 12/28/15 1232    OT LONG TERM GOAL #1   Title Patient will improve strength in LUE by 1 mm grade to assist with picking up items from  the floor.     Baseline difficulty picking up items from floor at eval   Time 8   Period Weeks   Status New   OT LONG TERM GOAL #2   Title Patient will improve coordination to complete buttons with modified independence with good speed.    Baseline difficulty and takes increased time.   Time 8   Period Weeks   Status New   OT LONG TERM GOAL #3   Title Patient will improve strength in bilateral UEs to don and doff socks independently.     Time 8   Period Weeks   Status New   OT LONG TERM GOAL #4   Title Patient will complete donning and doffing of compression garments with modified independence.    Baseline unable, dependent for this task at eval.   Time 8   Period Weeks   Status New   OT LONG TERM GOAL #5   Title Patient will improve coordination to tie shoes with modified independence.    Time 8   Period Weeks   Status New               Plan - 01/21/16 1843    Clinical Impression Statement Patient able to demo prehension grasp with opposition of thumb to  index finger this date for multiple reps, after fatigue therapist provided manual rotation of thumb to place onto objects for grasp.  Handwriting completed with regular pen and built up triangle grip pen, improved legbility with increased practice and built up pen.  Continue to work towards goals to increase independence in daily tasks.    Pt will benefit from skilled therapeutic intervention in order to improve on the following deficits (Retired) Decreased coordination;Decreased range of motion;Decreased strength;Impaired UE functional use   Rehab Potential Good   OT Frequency 2x / week   OT Duration 8 weeks   OT Treatment/Interventions Self-care/ADL training;Therapeutic exercise;Neuromuscular education;Therapeutic exercises;Patient/family education;DME and/or AE instruction;Therapeutic activities   Consulted and Agree with Plan of Care Patient        Problem List There are no active problems to display for this  patient. Kerrie Buffalomy T Tiauna Whisnant, OTR/L, CLT   Jquan Egelston 01/21/2016, 6:49 PM  [place] [facility] [address] [place], [place], [address] Phone: [phone]   Fax:  [phone]  Name: Gregory Deleon MRN: [id number] Date of Birth: 1990-11-30

## 2016-01-22 ENCOUNTER — Ambulatory Visit: Payer: 59 | Admitting: Physical Therapy

## 2016-01-22 ENCOUNTER — Encounter: Payer: Self-pay | Admitting: Physical Therapy

## 2016-01-22 ENCOUNTER — Ambulatory Visit: Payer: 59 | Admitting: Occupational Therapy

## 2016-01-22 DIAGNOSIS — R293 Abnormal posture: Secondary | ICD-10-CM

## 2016-01-22 DIAGNOSIS — R278 Other lack of coordination: Secondary | ICD-10-CM

## 2016-01-22 DIAGNOSIS — R460 Very low level of personal hygiene: Secondary | ICD-10-CM

## 2016-01-22 DIAGNOSIS — R531 Weakness: Secondary | ICD-10-CM | POA: Diagnosis not present

## 2016-01-22 DIAGNOSIS — R279 Unspecified lack of coordination: Secondary | ICD-10-CM

## 2016-01-22 DIAGNOSIS — R2689 Other abnormalities of gait and mobility: Secondary | ICD-10-CM

## 2016-01-22 DIAGNOSIS — Z741 Need for assistance with personal care: Secondary | ICD-10-CM

## 2016-01-22 NOTE — Therapy (Signed)
[facility] [address] [place], [place], [address] Phone: [phone]   Fax:  [phone]  Physical Therapy Treatment  Patient Details  Name: Gregory Deleon MRN: [id number] Date of Birth: [birth year] Referring Provider: Martha ClanWilliam Shaw MD  Encounter Date: 01/22/2016      PT End of Session - 01/22/16 1256    Visit Number 6   Number of Visits 17   Date for PT Re-Evaluation 02/20/16   PT Start Time 1100   PT Stop Time 1145   PT Time Calculation (min) 45 min   Equipment Utilized During Treatment Gait belt   Activity Tolerance Patient tolerated treatment well   Behavior During Therapy [facility] for tasks assessed/performed      Past Medical History  Diagnosis Date  . Spinal cord injury, C5-C7 (HCC)     incomplete    History reviewed. No pertinent past surgical history.  There were no vitals filed for this visit.  Visit Diagnosis:  Other lack of coordination  Weakness  Posture abnormality  Impaired gait and mobility      Subjective Assessment - 01/22/16 1255    Subjective patient reports doing okay; He reports having to push himself in his manual chair a lot yesterday with increased fatigue in BUE; He also reports episodes of left hip pain especially with doing knee to chest stretch; However he has no pain at rest;    Pertinent History personal factors affecting rehab: severity of injury   How long can you sit comfortably? NA, all day in wheelchair   How long can you stand comfortably? NA   How long can you walk comfortably? NA   Patient Stated Goals Work on floor transfers, hand dexterity;    Currently in Pain? Yes   Pain Score 5    Pain Location Hand   Pain Orientation Left   Pain Descriptors / Indicators Burning   Pain Type Chronic pain   Pain Onset More than a month ago      TREATMENT: Patient transferred from wheelchair<>mat table with scoot transfer;  Patient supine: Single UE shoulder flexion 5#  x10 bilaterally with min A for better coordination on LUE and mod VCs to increase ROM for better UE and trunk stretch; BUE shoulder abduction (angel wings) 5# in BUE x15 reps with cues to increase end range of motion for better strengthening; BUE Chest press 5# in each UE x15 reps; BUE arms up at 90 degrees flexion, shoulder horizontal abduction/adduction 5# in each UE x10 reps with min A on LUE for better coordination and control;  Patient prone: Prone on elbows: Stepping side/side with each forearm working on weight shifting and increasing weight bearing through UE x3 each direction; Patient exhibits increased fatigue with difficulty maintaining push through UE for better UE support; Sliding UE forward/backward working on trunk weight shift and shoulder/scapular control with weight shifting x5 each UE; Scapular protraction x15 reps;  Patient required min-moderate verbal/tactile cues for correct exercise technique with all exercise for better coordination and UE movement. He required mod VCs to improve weight shift and maintain push through single UE for better shoulder control with prone exercise;  Patient reports increased left hip discomfort when stretching knee to chest; PT assessed left hip; He tested positive with LLE scour test and increased pain with passive hip flexion/IR/adduction indicating increased likelihood of hip joint pathology. Recommended patient follow up with MD regarding hip discomfort;  PT Education - 01/22/16 1255    Education provided Yes   Education Details UE strengthening, posture, positioning;    Person(s) Educated Patient   Methods Explanation;Verbal cues   Comprehension Verbalized understanding;Returned demonstration;Verbal cues required             PT Long Term Goals - 12/26/15 0927    PT LONG TERM GOAL #1   Title Patient will be independent in home exercise program to improve strength/mobility for better  functional independence with ADLs. by 02/20/16   Time 8   Period Weeks   Status New   PT LONG TERM GOAL #2   Title Patient will be mod I for floor to chair transfers to improve mobility at home by 02/20/16   Time 8   Period Weeks   Status New   PT LONG TERM GOAL #3   Title Patient will demonstrate improved upper back/core abdominal strength to 4/5 for increased trunk control with ADLs by 02/20/16   Time 8   Period Weeks   Status New               Plan - 01/22/16 1256    Clinical Impression Statement Instructed patient in BUE strengthening exercise, working on weight bearing in prone and supine; Patient did require min A with free weights to improve UE coordination for better movement control, particularly in LUE; He reports increased fatigue at end of treatment session; Patient would benefit from additional skilled PT Intervention to improve UE strength for better functional mobility;    Pt will benefit from skilled therapeutic intervention in order to improve on the following deficits Decreased endurance;Hypomobility;Impaired tone;Decreased activity tolerance;Decreased strength;Pain;Impaired UE functional use;Decreased mobility;Improper body mechanics;Postural dysfunction;Decreased safety awareness   Rehab Potential Good   Clinical Impairments Affecting Rehab Potential positive: motivated, young in age; negative: severity of injury; Patient's clinical presentation is stable.    PT Frequency 2x / week   PT Duration 8 weeks   PT Treatment/Interventions ADLs/Self Care Home Management;Cryotherapy;Electrical Stimulation;Moist Heat;Balance training;Therapeutic exercise;Therapeutic activities;Functional mobility training;DME Instruction;Neuromuscular re-education;Patient/family education;Wheelchair mobility training;Taping;Energy conservation   PT Next Visit Plan work on floor transfers, work on Armed forces technical officer, work on UE strengthening   PT Home Exercise Plan continue as previously given    Consulted and Agree with Plan of Care Patient        Problem List There are no active problems to display for this patient.   Trotter,Margaret PT, DPT 01/22/2016, 12:58 PM  [place] [facility] MAIN [facility] SERVICES [address] [place], [place], [address] Phone: [phone]   Fax:  [phone]  Name: Gregory Deleon MRN: [id number] Date of Birth: Feb 06, 1991

## 2016-01-23 NOTE — Therapy (Signed)
[place] [facility] [address] [place], [place], [address] Phone: [phone]   Fax:  [phone]  Occupational Therapy Treatment  Patient Details  Name: Gregory Deleon MRN: [id number] Date of Birth: 1991-06-16 Referring Provider: Clelia CroftShaw  Encounter Date: 01/22/2016      OT End of Session - 01/23/16 1553    Visit Number 8   Number of Visits 16   Date for OT Re-Evaluation 02/19/16   OT Start Time 1016   OT Stop Time 1100   OT Time Calculation (min) 44 min   Activity Tolerance Patient tolerated treatment well   Behavior During Therapy [facility] for tasks assessed/performed      Past Medical History  Diagnosis Date  . Spinal cord injury, C5-C7 (HCC)     incomplete    No past surgical history on file.  There were no vitals filed for this visit.  Visit Diagnosis:  Weakness generalized  Lack of coordination  Self-care deficit for dressing and grooming      Subjective Assessment - 01/23/16 1551    Subjective  Patient reports he is doing well, nothing new or different this date.    Patient Stated Goals Patient reports he would like to be as independent as possible.  Wants to either go back to school or get a job soon.    Currently in Pain? Yes   Pain Score 5    Pain Location Hand   Pain Orientation Left   Pain Descriptors / Indicators Burning   Pain Type Chronic pain   Pain Onset More than a month ago   Multiple Pain Sites No                      OT Treatments/Exercises (OP) - 01/23/16 2013    Fine Motor Coordination   Other Fine Motor Exercises Patient seen for facilitation of oppositional grasp of items with thumb and index and thumb and middle finger for multiple reps and sets with cues and some manual rotation of thumb once patient fatigued. Utilized small foam pieces.  Oppositional movements with cues and manual assist with wrist over wedge to eliminate the use of tenodysis to perform task.    Neurological Re-education Exercises   Other Exercises 1 Patient seen this date for yellow theraband exercises for row, triceps press, ER and biceps for 10 reps for 2 sets.                  OT Education - 01/23/16 1552    Education provided Yes   Education Details HEP, UE strengthening   Person(s) Educated Patient   Methods Explanation;Demonstration;Verbal cues   Comprehension Verbal cues required;Returned demonstration;Verbalized understanding             OT Long Term Goals - 12/28/15 1232    OT LONG TERM GOAL #1   Title Patient will improve strength in LUE by 1 mm grade to assist with picking up items from the floor.     Baseline difficulty picking up items from floor at eval   Time 8   Period Weeks   Status New   OT LONG TERM GOAL #2   Title Patient will improve coordination to complete buttons with modified independence with good speed.    Baseline difficulty and takes increased time.   Time 8   Period Weeks   Status New   OT LONG TERM GOAL #3   Title Patient will improve strength in bilateral UEs  to don and doff socks independently.     Time 8   Period Weeks   Status New   OT LONG TERM GOAL #4   Title Patient will complete donning and doffing of compression garments with modified independence.    Baseline unable, dependent for this task at eval.   Time 8   Period Weeks   Status New   OT LONG TERM GOAL #5   Title Patient will improve coordination to tie shoes with modified independence.    Time 8   Period Weeks   Status New               Plan - 01/23/16 1553    Pt will benefit from skilled therapeutic intervention in order to improve on the following deficits (Retired) Decreased coordination;Decreased range of motion;Decreased strength;Impaired UE functional use   Rehab Potential Good   OT Frequency 2x / week   OT Duration 8 weeks   OT Treatment/Interventions Self-care/ADL training;Therapeutic exercise;Neuromuscular education;Therapeutic  exercises;Patient/family education;DME and/or AE instruction;Therapeutic activities   Consulted and Agree with Plan of Care Patient        Problem List There are no active problems to display for this patient.  Gregory Deleon, OTR/L, CLT  Gregory Deleon 01/23/2016, 8:16 PM  [place] [facility] MAIN [facility] SERVICES [address] [place], [place], [address] Phone: [phone]   Fax:  [phone]  Name: Gregory Deleon MRN: [id number] Date of Birth: [birth year]

## 2016-01-27 ENCOUNTER — Ambulatory Visit: Payer: 59 | Admitting: Physical Therapy

## 2016-01-27 ENCOUNTER — Ambulatory Visit: Payer: 59 | Admitting: Occupational Therapy

## 2016-01-27 ENCOUNTER — Encounter: Payer: Self-pay | Admitting: Physical Therapy

## 2016-01-27 DIAGNOSIS — R460 Very low level of personal hygiene: Secondary | ICD-10-CM

## 2016-01-27 DIAGNOSIS — R279 Unspecified lack of coordination: Secondary | ICD-10-CM

## 2016-01-27 DIAGNOSIS — R293 Abnormal posture: Secondary | ICD-10-CM

## 2016-01-27 DIAGNOSIS — R531 Weakness: Secondary | ICD-10-CM | POA: Diagnosis not present

## 2016-01-27 DIAGNOSIS — Z741 Need for assistance with personal care: Secondary | ICD-10-CM

## 2016-01-27 DIAGNOSIS — R278 Other lack of coordination: Secondary | ICD-10-CM

## 2016-01-27 DIAGNOSIS — R2689 Other abnormalities of gait and mobility: Secondary | ICD-10-CM

## 2016-01-27 NOTE — Therapy (Signed)
[facility] MAIN [facility] SERVICES [address] [place], [place], [address] Phone: [phone]   Fax:  [phone]  Physical Therapy Treatment  Patient Details  Name: Gregory Deleon MRN: [id number] Date of Birth: Oct 19, 1991 Referring Provider: Martha Clan MD  Encounter Date: 01/27/2016      PT End of Session - 01/27/16 1313    Visit Number 7   Number of Visits 17   Date for PT Re-Evaluation 02/20/16   PT Start Time 1100   PT Stop Time 1140   PT Time Calculation (min) 40 min   Equipment Utilized During Treatment Gait belt   Activity Tolerance Patient tolerated treatment well   Behavior During Therapy [facility] for tasks assessed/performed      Past Medical History  Diagnosis Date  . Spinal cord injury, C5-C7 (HCC)     incomplete    History reviewed. No pertinent past surgical history.  There were no vitals filed for this visit.  Visit Diagnosis:  Weakness  Posture abnormality  Impaired gait and mobility  Other lack of coordination      Subjective Assessment - 01/27/16 1259    Subjective Patient reports doing well. He presents to therapy with extra wrist straps with hooks for UE weightlifting. Patient reports compliance with HEP; denies any pain currently;    Pertinent History personal factors affecting rehab: severity of injury   How long can you sit comfortably? NA, all day in wheelchair   How long can you stand comfortably? NA   How long can you walk comfortably? NA   Patient Stated Goals Work on floor transfers, hand dexterity;    Currently in Pain? No/denies   Pain Onset More than a month ago      TREATMENT: Prior to therapeutic activity: PT instructed patient in BUE strengthening: Matrix: UE D1 flexion 7.5# x15 bilaterally; UE D1 extension 2.5# x15 bilaterally;   prone on elbows, scapular protraction x20 Prone on elbows, scooting side/side on mat table with stepping using UE forearm x3 reps each direction;    Patient continues to have hypertonicity in BLE with movement;  Following exercise; PT instructed patient in scooting transfer sitting on blue mat table, and scooting up to 4 inch step with BUE pushing x1 rep; Patient required assistance for placing knee to chest prior to transfer and then mod VCs for hand placement, including to scoot LUE backwards after each scoot for better mechanical advance.   Patient required min-moderate verbal/tactile cues for correct exercise technique for all exercise. He was able to increase resistance and repetition with minimal increase in fatigue;                              PT Education - 01/27/16 1311    Education provided Yes   Education Details UE strengthening, transfers   Person(s) Educated Patient   Methods Explanation;Verbal cues   Comprehension Verbalized understanding;Returned demonstration;Verbal cues required             PT Long Term Goals - 12/26/15 0927    PT LONG TERM GOAL #1   Title Patient will be independent in home exercise program to improve strength/mobility for better functional independence with ADLs. by 02/20/16   Time 8   Period Weeks   Status New   PT LONG TERM GOAL #2   Title Patient will be mod I for floor to chair transfers to improve mobility at home by 02/20/16  Time 8   Period Weeks   Status New   PT LONG TERM GOAL #3   Title Patient will demonstrate improved upper back/core abdominal strength to 4/5 for increased trunk control with ADLs by 02/20/16   Time 8   Period Weeks   Status New               Plan - 01/27/16 1313    Clinical Impression Statement Instructed patient in BUE strengthening and functional mobility; Patient continues to require cues to increase ROM for UE strengthening to improve motor control. He was able to perform increased repetition with resisted exercise. Patient also reports less fatigue with prone on elbows strengthening. Patient had increased difficulty  with scooting onto step due to UE fatigue. He would benefit from additional skilled PT intervention to improve floor transfer and mobility;    Pt will benefit from skilled therapeutic intervention in order to improve on the following deficits Decreased endurance;Hypomobility;Impaired tone;Decreased activity tolerance;Decreased strength;Pain;Impaired UE functional use;Decreased mobility;Improper body mechanics;Postural dysfunction;Decreased safety awareness   Rehab Potential Good   Clinical Impairments Affecting Rehab Potential positive: motivated, young in age; negative: severity of injury; Patient's clinical presentation is stable.    PT Frequency 2x / week   PT Duration 8 weeks   PT Treatment/Interventions ADLs/Self Care Home Management;Cryotherapy;Electrical Stimulation;Moist Heat;Balance training;Therapeutic exercise;Therapeutic activities;Functional mobility training;DME Instruction;Neuromuscular re-education;Patient/family education;Wheelchair mobility training;Taping;Energy conservation   PT Next Visit Plan work on floor transfers, work on Armed forces technical officertipping wheelchair, work on UE strengthening   PT Home Exercise Plan continue as previously given   Consulted and Agree with Plan of Care Patient        Problem List There are no active problems to display for this patient.   Sophia Sperry PT, DPT 01/27/2016, 1:15 PM  [facility] [address] [place], [place], [address] Phone: [phone]   Fax:  [phone]  Name: Gregory Deleon MRN: [id number] Date of Birth: [birth year]

## 2016-01-28 ENCOUNTER — Ambulatory Visit
Admission: RE | Admit: 2016-01-28 | Discharge: 2016-01-28 | Disposition: A | Discharge status: Home / Self Care | Payer: 59 | Source: Ambulatory Visit | Attending: Physical Medicine and Rehabilitation | Admitting: Physical Medicine and Rehabilitation

## 2016-01-28 ENCOUNTER — Encounter: Payer: Self-pay | Admitting: Occupational Therapy

## 2016-01-28 DIAGNOSIS — G8254 Quadriplegia, C5-C7 incomplete: Secondary | ICD-10-CM

## 2016-01-28 DIAGNOSIS — M8580 Other specified disorders of bone density and structure, unspecified site: Secondary | ICD-10-CM

## 2016-01-28 NOTE — Therapy (Signed)
[facility] [facility] [address] [place], [place], [address] Phone: [phone]   Fax:  [phone]  Occupational Therapy Treatment  Patient Details  Name: Sampson GoonHunter C Zeitz MRN: [id number] Date of Birth: August 05, 1991 Referring Provider: Clelia CroftShaw  Encounter Date: 01/27/2016      OT End of Session - 01/28/16 1924    Visit Number 9   Number of Visits 16   Date for OT Re-Evaluation 02/19/16   OT Start Time 1015   OT Stop Time 1100   OT Time Calculation (min) 45 min   Activity Tolerance Patient tolerated treatment well   Behavior During Therapy [facility] for tasks assessed/performed      Past Medical History  Diagnosis Date  . Spinal cord injury, C5-C7 (HCC)     incomplete    History reviewed. No pertinent past surgical history.  There were no vitals filed for this visit.  Visit Diagnosis:  Weakness  Self-care deficit for dressing and grooming  Lack of coordination      Subjective Assessment - 01/28/16 1922    Subjective  Patient reports he uses his teeth to open and close his book bag.     Patient Stated Goals Patient reports he would like to be as independent as possible.  Wants to either go back to school or get a job soon.    Currently in Pain? No/denies   Pain Score 0-No pain                      OT Treatments/Exercises (OP) - 01/28/16 1926    Neurological Re-education Exercises   Other Exercises 1 Patient seen for facilitation of pinch with good prehension pattern for light resistance yellow and red jumbo resistive pins, using right hand to pinch with left hand to place a card forwards in slight elevated plane and clip it to yardstick.  Removed the pins with the opposite hand.  Manipulation of nuts and bolts of various sizes with cues for placement of thumb and finger on bolt with left hand.                  OT Education - 01/28/16 1923    Education provided Yes   Education Details HEP,  coordination, discussion regarding compression hose   Person(s) Educated Patient   Methods Explanation;Demonstration;Verbal cues   Comprehension Verbal cues required;Returned demonstration;Verbalized understanding             OT Long Term Goals - 12/28/15 1232    OT LONG TERM GOAL #1   Title Patient will improve strength in LUE by 1 mm grade to assist with picking up items from the floor.     Baseline difficulty picking up items from floor at eval   Time 8   Period Weeks   Status New   OT LONG TERM GOAL #2   Title Patient will improve coordination to complete buttons with modified independence with good speed.    Baseline difficulty and takes increased time.   Time 8   Period Weeks   Status New   OT LONG TERM GOAL #3   Title Patient will improve strength in bilateral UEs to don and doff socks independently.     Time 8   Period Weeks   Status New   OT LONG TERM GOAL #4   Title Patient will complete donning and doffing of compression garments with modified independence.    Baseline unable, dependent for this task at eval.  Time 8   Period Weeks   Status New   OT LONG TERM GOAL #5   Title Patient will improve coordination to tie shoes with modified independence.    Time 8   Period Weeks   Status New               Plan - 01/28/16 1924    Clinical Impression Statement Patient continues to demonstrate improved pinch and grasp patterns with left UE.  Visable contraction of triceps muscle on the left this date.  Will plan to address compression hose donning in the next 1-2 sessions with adaptive equipment.    Pt will benefit from skilled therapeutic intervention in order to improve on the following deficits (Retired) Decreased coordination;Decreased range of motion;Decreased strength;Impaired UE functional use   Rehab Potential Good   OT Frequency 2x / week   OT Duration 8 weeks   OT Treatment/Interventions Self-care/ADL training;Therapeutic exercise;Neuromuscular  education;Therapeutic exercises;Patient/family education;DME and/or AE instruction;Therapeutic activities   Consulted and Agree with Plan of Care Patient        Problem List There are no active problems to display for this patient.  Kerrie Buffalo, OTR/L, CLT  Kaneisha Ellenberger 01/28/2016, 7:30 PM  [place] [facility] MAIN [facility] SERVICES [address] [place], [place], [address] Phone: [phone]   Fax:  [phone]  Name: YARIEL FERRARIS MRN: [id number] Date of Birth: [birth year]

## 2016-01-29 ENCOUNTER — Ambulatory Visit: Payer: 59 | Admitting: Occupational Therapy

## 2016-01-29 ENCOUNTER — Encounter: Payer: Self-pay | Admitting: Physical Therapy

## 2016-01-29 ENCOUNTER — Ambulatory Visit: Payer: 59 | Admitting: Physical Therapy

## 2016-01-29 DIAGNOSIS — R531 Weakness: Secondary | ICD-10-CM

## 2016-01-29 DIAGNOSIS — R293 Abnormal posture: Secondary | ICD-10-CM

## 2016-01-29 DIAGNOSIS — R278 Other lack of coordination: Secondary | ICD-10-CM

## 2016-01-29 DIAGNOSIS — R460 Very low level of personal hygiene: Secondary | ICD-10-CM

## 2016-01-29 DIAGNOSIS — R2689 Other abnormalities of gait and mobility: Secondary | ICD-10-CM

## 2016-01-29 DIAGNOSIS — Z741 Need for assistance with personal care: Secondary | ICD-10-CM

## 2016-01-29 NOTE — Therapy (Signed)
[place] [facility] [address] [place], [place], [address] Phone: [phone]   Fax:  [phone]  Physical Therapy Treatment  Patient Details  Name: Gregory Deleon MRN: [id number] Date of Birth: [birth year] Referring Provider: Martha ClanWilliam Shaw MD  Encounter Date: 01/29/2016      PT End of Session - 01/29/16 1102    Visit Number 8   Number of Visits 17   Date for PT Re-Evaluation 02/20/16   PT Start Time 1015   PT Stop Time 1055   PT Time Calculation (min) 40 min   Equipment Utilized During Treatment Gait belt   Activity Tolerance Patient tolerated treatment well   Behavior During Therapy [facility] for tasks assessed/performed      Past Medical History  Diagnosis Date  . Spinal cord injury, C5-C7 (HCC)     incomplete    History reviewed. No pertinent past surgical history.  There were no vitals filed for this visit.  Visit Diagnosis:  Weakness  Posture abnormality  Impaired gait and mobility      Subjective Assessment - 01/29/16 1026    Subjective Patient reports doing well; He has liquid bandaid on RUE hand for less skin discomfort;    Pertinent History personal factors affecting rehab: severity of injury   How long can you sit comfortably? NA, all day in wheelchair   How long can you stand comfortably? NA   How long can you walk comfortably? NA   Patient Stated Goals Work on floor transfers, hand dexterity;    Currently in Pain? No/denies   Pain Onset More than a month ago        TREATMENT: Prone on table: BUE shoulder extension 5# x10 with AAROM each UE; BUE horizontal abduction 5# x10 with min VCs to increase scapular retraction for better upper back strengthening; BUE mid row 5#   BUE x15; Patient able to demonstrate better scapular control with mid row as compared to long arm strengthening;  Patient transitioned prone to sitting with min A; He does require assistance for lifting/lowering LE;  In  sitting, patient able to exhibit LAQ partial ROM with BLE; He reports being able to push through BLE with initial transfer;  Seated pball LAQ x5 reps with BLE;   PT had patient transfer to leg press, with scoot transfer,  Leg Press; BLE 60#, x10, 75# x10 requiring min A to avoid hip ER/abduction but being able to push into knee extension without assistance; Patient does require max A for positioning LE onto leg press with increased clonus; He exhibits increased fatigue with 75# with that being his 10 rep max; Would benefit from additional LE strengthening to work on floor/scoot transfers;                            PT Education - 01/29/16 1101    Education provided Yes   Education Details LE strengthening, scapular strengthening;    Person(s) Educated Patient   Methods Explanation;Verbal cues   Comprehension Verbalized understanding;Returned demonstration;Verbal cues required             PT Long Term Goals - 12/26/15 0927    PT LONG TERM GOAL #1   Title Patient will be independent in home exercise program to improve strength/mobility for better functional independence with ADLs. by 02/20/16   Time 8   Period Weeks   Status New   PT LONG TERM GOAL #2   Title  Patient will be mod I for floor to chair transfers to improve mobility at home by 02/20/16   Time 8   Period Weeks   Status New   PT LONG TERM GOAL #3   Title Patient will demonstrate improved upper back/core abdominal strength to 4/5 for increased trunk control with ADLs by 02/20/16   Time 8   Period Weeks   Status New               Plan - 01/29/16 1406    Clinical Impression Statement Patient reports increased fatigue and soreness in BUE; He denies any pain. Patient instructed in UE strengthening with cues for better UE positioning to improve scapular retraction. Upon sitting up, patient demonstrates being able to perform a LAQ partial ROM. PT instructed patient in pball leg press while in  wheelchair x5 reps; Patient was then able to transfer to leg press, and perform 10 reps at 75# for LE strengthening. He would benefit from additional skilled PT Intervention to improve UE/LE strength for increased functional mobility;    Pt will benefit from skilled therapeutic intervention in order to improve on the following deficits Decreased endurance;Hypomobility;Impaired tone;Decreased activity tolerance;Decreased strength;Pain;Impaired UE functional use;Decreased mobility;Improper body mechanics;Postural dysfunction;Decreased safety awareness   Rehab Potential Good   Clinical Impairments Affecting Rehab Potential positive: motivated, young in age; negative: severity of injury; Patient's clinical presentation is stable.    PT Frequency 2x / week   PT Duration 8 weeks   PT Treatment/Interventions ADLs/Self Care Home Management;Cryotherapy;Electrical Stimulation;Moist Heat;Balance training;Therapeutic exercise;Therapeutic activities;Functional mobility training;DME Instruction;Neuromuscular re-education;Patient/family education;Wheelchair mobility training;Taping;Energy conservation   PT Next Visit Plan work on floor transfers, work on Armed forces technical officer, work on UE strengthening   PT Home Exercise Plan continue as previously given   Consulted and Agree with Plan of Care Patient        Problem List There are no active problems to display for this patient.   Trotter,Margaret PT, DPT 01/29/2016, 2:08 PM  [place] [facility] MAIN [facility] SERVICES [address] [place], [place], [address] Phone: [phone]   Fax:  [phone]  Name: Gregory Deleon MRN: [id number] Date of Birth: 1991-09-06

## 2016-01-31 ENCOUNTER — Encounter: Payer: Self-pay | Admitting: Occupational Therapy
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
## 2016-02-03 ENCOUNTER — Encounter: Payer: Self-pay | Admitting: Occupational Therapy

## 2016-02-03 ENCOUNTER — Ambulatory Visit: Payer: 59 | Admitting: Physical Therapy

## 2016-02-03 ENCOUNTER — Encounter: Payer: Self-pay | Admitting: Physical Therapy

## 2016-02-03 ENCOUNTER — Ambulatory Visit: Payer: 59 | Admitting: Occupational Therapy

## 2016-02-03 DIAGNOSIS — R531 Weakness: Secondary | ICD-10-CM

## 2016-02-03 DIAGNOSIS — R278 Other lack of coordination: Secondary | ICD-10-CM

## 2016-02-03 DIAGNOSIS — R293 Abnormal posture: Secondary | ICD-10-CM

## 2016-02-03 DIAGNOSIS — R460 Very low level of personal hygiene: Secondary | ICD-10-CM

## 2016-02-03 DIAGNOSIS — Z741 Need for assistance with personal care: Secondary | ICD-10-CM

## 2016-02-03 DIAGNOSIS — R2689 Other abnormalities of gait and mobility: Secondary | ICD-10-CM

## 2016-02-03 DIAGNOSIS — R279 Unspecified lack of coordination: Secondary | ICD-10-CM

## 2016-02-03 NOTE — Therapy (Signed)
[facility] [address] [place], [place], [address] Phone: [phone]   Fax:  [phone]  Physical Therapy Treatment  Patient Details  Name: Gregory Deleon C Rilling MRN: [id number] Date of Birth: 09-01-1991 Referring Provider: Martha ClanWilliam Shaw MD  Encounter Date: 02/03/2016      PT End of Session - 02/03/16 1247    Visit Number 9   Number of Visits 17   Date for PT Re-Evaluation 02/20/16   PT Start Time 1100   PT Stop Time 1143   PT Time Calculation (min) 43 min   Equipment Utilized During Treatment Gait belt   Activity Tolerance Patient tolerated treatment well   Behavior [facility] Therapy [facility] for tasks assessed/performed      Past Medical History  Diagnosis Date  . Spinal cord injury, C5-C7 (HCC)     incomplete    History reviewed. No pertinent past surgical history.  There were no vitals filed for this visit.  Visit Diagnosis:  Impaired gait and mobility  Posture abnormality  Lack of coordination  Weakness generalized      Subjective Assessment - 02/03/16 1107    Subjective Patient reports doing well; He denies any soreness after last session; Denies any new falls;    Pertinent History personal factors affecting rehab: severity of injury   How long can you sit comfortably? NA, all day in wheelchair   How long can you stand comfortably? NA   How long can you walk comfortably? NA   Patient Stated Goals Work on floor transfers, hand dexterity;    Currently in Pain? No/denies   Pain Onset More than a month ago        TREATMENT: Prior to therapeutic activity:  PT had patient transfer to leg press, with scoot transfer,  Leg Press; BLE 75# 2x12 requiring min A to avoid hip ER/abduction but being able to push into knee extension without assistance; Patient does require max A for positioning LE onto leg press with increased clonus;   Following therapeutic exercise: Patient transferred to low mat table, mod  I Had patient turn over on stomach and lower into tall kneeling position; Patient able to keep balance leaning over mat table; He is max A for positioning LE into 1/2 kneeling; Patient started leaning to left side with positioning RLE, and then sat down. He had difficulty maintaining hip extension for 1/2 kneeling position. Patient required max A for getting back into tall kneeling against mat table; Positioned patient to 1/2 kneeling with LLE, he was unable to push through LE due to weakness but was mod A for pulling self up onto mat table to prone position;  Patient fatigued quickly with floor transfer due to postural weakness.  While prone: Quad set x10 reps bilaterally; Isometric knee extension 5 sec hold x5; Patient required min-moderate verbal/tactile cues for correct exercise technique.                        PT Education - 02/03/16 1246    Education provided Yes   Education Details strengthening, floor transfers;    Person(s) Educated Patient   Methods Explanation;Verbal cues   Comprehension Verbalized understanding;Returned demonstration;Verbal cues required             PT Long Term Goals - 12/26/15 0927    PT LONG TERM GOAL #1   Title Patient will be independent in home exercise program to improve strength/mobility for better functional independence with ADLs. by  02/20/16   Time 8   Period Weeks   Status New   PT LONG TERM GOAL #2   Title Patient will be mod I for floor to chair transfers to improve mobility at home by 02/20/16   Time 8   Period Weeks   Status New   PT LONG TERM GOAL #3   Title Patient will demonstrate improved upper back/core abdominal strength to 4/5 for increased trunk control with ADLs by 02/20/16   Time 8   Period Weeks   Status New               Plan - 02/03/16 1247    Clinical Impression Statement Patient instructed in BLE strengthening with leg press. He requires min A for holding LE into position to avoid hip  ER/abduction to improve quad control. PT educated patient in floor transfer working on transitioning from tall kneeling to 1/2 kneeling to sitting on mat table. Patient had difficulty pushing through LE despite assistance and was mod A for getting prone on mat table. Would benefit from additional skilled PT intervention to improve functional mobility;    Pt will benefit from skilled therapeutic intervention in order to improve on the following deficits Decreased endurance;Hypomobility;Impaired tone;Decreased activity tolerance;Decreased strength;Pain;Impaired UE functional use;Decreased mobility;Improper body mechanics;Postural dysfunction;Decreased safety awareness   Rehab Potential Good   Clinical Impairments Affecting Rehab Potential positive: motivated, young in age; negative: severity of injury; Patient's clinical presentation is stable.    PT Frequency 2x / week   PT Duration 8 weeks   PT Treatment/Interventions ADLs/Self Care Home Management;Cryotherapy;Electrical Stimulation;Moist Heat;Balance training;Therapeutic exercise;Therapeutic activities;Functional mobility training;DME Instruction;Neuromuscular re-education;Patient/family education;Wheelchair mobility training;Taping;Energy conservation   PT Next Visit Plan work on floor transfers, work on Armed forces technical officer, work on UE strengthening   PT Home Exercise Plan continue as previously given   Consulted and Agree with Plan of Care Patient        Problem List There are no active problems to display for this patient.   Trotter,Margaret PT, DPT 02/03/2016, 12:50 PM  [place] [facility] MAIN [facility] SERVICES [address] [place], [place], [address] Phone: [phone]   Fax:  [phone]  Name: DESHON HSIAO MRN: [id number] Date of Birth: 04-03-1991

## 2016-02-05 ENCOUNTER — Encounter: Payer: Self-pay | Admitting: Physical Therapy

## 2016-02-05 ENCOUNTER — Ambulatory Visit: Payer: 59 | Admitting: Physical Therapy

## 2016-02-05 ENCOUNTER — Ambulatory Visit: Payer: 59 | Admitting: Occupational Therapy

## 2016-02-05 DIAGNOSIS — R2689 Other abnormalities of gait and mobility: Secondary | ICD-10-CM

## 2016-02-05 DIAGNOSIS — R293 Abnormal posture: Secondary | ICD-10-CM

## 2016-02-05 DIAGNOSIS — R279 Unspecified lack of coordination: Secondary | ICD-10-CM

## 2016-02-05 DIAGNOSIS — Z741 Need for assistance with personal care: Secondary | ICD-10-CM

## 2016-02-05 DIAGNOSIS — R460 Very low level of personal hygiene: Secondary | ICD-10-CM

## 2016-02-05 DIAGNOSIS — R531 Weakness: Secondary | ICD-10-CM

## 2016-02-05 NOTE — Therapy (Signed)
[place] MAIN [facility] SERVICES [address] [place], [place], [address] Phone: [phone]   Fax:  [phone]  Physical Therapy Treatment  Patient Details  Name: Gregory Deleon MRN: [id number] Date of Birth: [birth year] Referring Provider: Marton Redwood MD  Encounter Date: 02/05/2016      PT End of Session - 02/05/16 1259    Visit Number 10   Number of Visits 17   Date for PT Re-Evaluation 02/20/16   PT Start Time 9826   PT Stop Time 1100   PT Time Calculation (min) 45 min   Equipment Utilized During Treatment Gait belt   Activity Tolerance Patient tolerated treatment well   Behavior During Therapy [facility] for tasks assessed/performed      Past Medical History  Diagnosis Date  . Spinal cord injury, C5-C7 ([place])     incomplete    History reviewed. No pertinent past surgical history.  There were no vitals filed for this visit.  Visit Diagnosis:  Posture abnormality  Impaired gait and mobility  Lack of coordination  Weakness generalized      Subjective Assessment - 02/05/16 1255    Subjective Patient reports having a tough morning with "nothing going right". He denies any pain; He denies any new falls. He reports no increase in soreness in LE with advanced exercise.    Pertinent History personal factors affecting rehab: severity of injury   How long can you sit comfortably? NA, all day in wheelchair   How long can you stand comfortably? NA   How long can you walk comfortably? NA   Patient Stated Goals Work on floor transfers, hand dexterity;    Currently in Pain? No/denies   Pain Onset More than a month ago        TREATMENT:  Leg press, BLE 90# x5 reps, 75# 2x10 with min A for maintain hip/knee position to avoid hip ER for better quad strengthening. Patient required min VCs to improve breath control for better quad muscle activation;  Patient requires min A for scoot transfer to/from leg press machine and is max A for  placing LE onto leg press;  Patient transferred to mat table, mod I with scoot transfer;  Patient prone: BLE glute squeeze x10 reps, 3 sec hold with min VCS for increase hold time to increase strengthening; PT assessed LE hamstring muscle activation. Patient unable to initiate hamstring activation in prone;  Patient is able to demonstrate better initiation of qped positioning, requiring only min A to get into Qped position; Full kneeling to tall kneeling transition working on quad strengthening and hip control with max A and chair to push up on UE for better positioning x3 reps; Patient fatigues quickly due to UE weakness and decreased trunk control;                           PT Education - 02/05/16 1259    Education provided Yes   Education Details strengthening, positioning   Person(s) Educated Patient   Methods Explanation;Verbal cues   Comprehension Verbalized understanding;Returned demonstration;Verbal cues required             PT Long Term Goals - 02/05/16 1300    PT LONG TERM GOAL #1   Title Patient will be independent in home exercise program to improve strength/mobility for better functional independence with ADLs. by 02/20/16   Time 8   Period Weeks   Status On-going   PT LONG TERM  GOAL #2   Title Patient will be mod I for floor to chair transfers to improve mobility at home by 02/20/16   Time 8   Period Weeks   Status Partially Met   PT LONG TERM GOAL #3   Title Patient will demonstrate improved upper back/core abdominal strength to 4/5 for increased trunk control with ADLs by 02/20/16   Time 8   Period Weeks   Status Partially Met               Plan - 02/05/16 1259    Clinical Impression Statement Patient instructed in advanced LE strengthening. He is able to demonstrate better functional mobility being able to initiate qped positioning more. He still has difficulty with floor transfers, requiring mod-max A for floor transfer with  cues for positioning. He would benefit from additional skilled PT Intervention to improve LE strength, UE strength and functional mobility;    Pt will benefit from skilled therapeutic intervention in order to improve on the following deficits Decreased endurance;Hypomobility;Impaired tone;Decreased activity tolerance;Decreased strength;Pain;Impaired UE functional use;Decreased mobility;Improper body mechanics;Postural dysfunction;Decreased safety awareness   Rehab Potential Good   Clinical Impairments Affecting Rehab Potential positive: motivated, young in age; negative: severity of injury; Patient's clinical presentation is stable.    PT Frequency 2x / week   PT Duration 8 weeks   PT Treatment/Interventions ADLs/Self Care Home Management;Cryotherapy;Electrical Stimulation;Moist Heat;Balance training;Therapeutic exercise;Therapeutic activities;Functional mobility training;DME Instruction;Neuromuscular re-education;Patient/family education;Wheelchair mobility training;Taping;Energy conservation   PT Next Visit Plan work on floor transfers, work on Nurse, mental health, work on Foster continue as previously given   Consulted and Agree with Plan of Care Patient        Problem List There are no active problems to display for this patient.   Trotter,Margaret PT, DPT 02/05/2016, 1:01 PM  [place] MAIN [facility] SERVICES [address] [place], [place], [address] Phone: [phone]   Fax:  [phone]  Name: Gregory Deleon MRN: [id number] Date of Birth: 06-30-1991

## 2016-02-05 NOTE — Therapy (Addendum)
[facility] [address] [place], [place], [address] Phone: [phone]   Fax:  [phone]  Occupational Therapy Treatment  Patient Details  Name: Gregory Deleon MRN: [id number] Date of Birth: 26-Aug-1991 Referring Provider: Clelia CroftShaw  Encounter Date: 02/03/2016      OT End of Session - 02/05/16 1934    Visit Number 11   Number of Visits 16   Date for OT Re-Evaluation 02/19/16   OT Start Time 1015   OT Stop Time 1103   OT Time Calculation (min) 48 min   Activity Tolerance Patient tolerated treatment well   Behavior During Therapy [facility] for tasks assessed/performed      Past Medical History  Diagnosis Date  . Spinal cord injury, C5-C7 (HCC)     incomplete    History reviewed. No pertinent past surgical history.  There were no vitals filed for this visit.  Visit Diagnosis:  Weakness  Other lack of coordination  Self-care deficit for dressing and grooming      Subjective Assessment - 02/05/16 1930    Subjective  Patient with no complaints, doing well, trying to work on getting up from the floor with PT.   Patient Stated Goals Patient reports he would like to be as independent as possible.  Wants to either go back to school or get a job soon.    Currently in Pain? Yes   Pain Score 5    Pain Location Hand   Pain Orientation Left   Pain Descriptors / Indicators Burning   Pain Type Chronic pain   Pain Onset More than a month ago   Multiple Pain Sites No                      OT Treatments/Exercises (OP) - 02/05/16 1931    Fine Motor Coordination   Other Fine Motor Exercises Patient seen for fine motor coordination skills with focus on left hand but also working on refining right hand as well. Thumb finger combinations for manipulation of objects, flat objects from flat surface, cues provided.    Neurological Re-education Exercises   Other Exercises 1 Patient seen this date for lateral pinch skills  with light resistance yellow pinch pins with right and left hands, cues for technique and to decrease compensatory strategies.                  OT Education - 02/05/16 1934    Education provided Yes   Education Details manipulation of items, pinch skills   Person(s) Educated Patient   Methods Explanation;Demonstration;Verbal cues   Comprehension Verbal cues required;Returned demonstration;Verbalized understanding             OT Long Term Goals - 01/31/16 1155    OT LONG TERM GOAL #1   Title Patient will improve strength in LUE by 1 mm grade to assist with picking up items from the floor.     Baseline difficulty picking up items from floor at eval   Time 8   Period Weeks   Status On-going   OT LONG TERM GOAL #2   Title Patient will improve coordination to complete buttons with modified independence with good speed.    Baseline difficulty and takes increased time.   Time 8   Period Weeks   Status On-going   OT LONG TERM GOAL #3   Title Patient will improve strength in bilateral UEs to don and doff socks independently.  Time 8   Period Weeks   Status On-going   OT LONG TERM GOAL #4   Title Patient will complete donning and doffing of compression garments with modified independence.    Time 8   Period Weeks   Status On-going   OT LONG TERM GOAL #5   Title Patient will improve coordination to tie shoes with modified independence.    Time 8   Status On-going               Plan - 02/05/16 1935    Clinical Impression Statement Patient continues to show increased activation of lateral pinch skills and manipulation skills of left hand as well as right for strengthening and coordination.     Pt will benefit from skilled therapeutic intervention in order to improve on the following deficits (Retired) Decreased coordination;Decreased range of motion;Decreased strength;Impaired UE functional use   Rehab Potential Good   OT Frequency 2x / week   OT Duration 8  weeks   OT Treatment/Interventions Self-care/ADL training;Therapeutic exercise;Neuromuscular education;Therapeutic exercises;Patient/family education;DME and/or AE instruction;Therapeutic activities   Consulted and Agree with Plan of Care Patient        Problem List There are no active problems to display for this patient.  Kerrie Buffalo, OTR/L, CLT  Lovett,Amy 02/05/2016, 7:36 PM  [place] [facility] MAIN [facility] SERVICES [address] [place], [place], [address] Phone: [phone]   Fax:  [phone]  Name: Gregory Deleon MRN: [id number] Date of Birth: 1991/06/26

## 2016-02-07 ENCOUNTER — Encounter: Payer: Self-pay | Admitting: Occupational Therapy
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
## 2016-02-11 ENCOUNTER — Encounter: Payer: 59 | Admitting: Occupational Therapy

## 2016-02-11 ENCOUNTER — Ambulatory Visit: Payer: 59 | Admitting: Physical Therapy

## 2016-02-14 ENCOUNTER — Ambulatory Visit: Payer: 59 | Attending: Internal Medicine

## 2016-02-14 ENCOUNTER — Ambulatory Visit: Payer: 59 | Admitting: Physical Therapy

## 2016-02-14 ENCOUNTER — Ambulatory Visit: Payer: 59 | Admitting: Occupational Therapy

## 2016-02-14 ENCOUNTER — Encounter: Payer: 59 | Admitting: Occupational Therapy

## 2016-02-14 DIAGNOSIS — R278 Other lack of coordination: Secondary | ICD-10-CM | POA: Diagnosis present

## 2016-02-14 DIAGNOSIS — R293 Abnormal posture: Secondary | ICD-10-CM | POA: Diagnosis present

## 2016-02-14 DIAGNOSIS — R531 Weakness: Secondary | ICD-10-CM | POA: Diagnosis present

## 2016-02-14 DIAGNOSIS — M6281 Muscle weakness (generalized): Secondary | ICD-10-CM | POA: Insufficient documentation

## 2016-02-14 DIAGNOSIS — R279 Unspecified lack of coordination: Secondary | ICD-10-CM | POA: Insufficient documentation

## 2016-02-14 DIAGNOSIS — R2689 Other abnormalities of gait and mobility: Secondary | ICD-10-CM | POA: Diagnosis present

## 2016-02-14 NOTE — Therapy (Signed)
[place] MAIN [facility] SERVICES [address] [place], [place], [address] Phone: [phone]   Fax:  [phone]  Physical Therapy Treatment  Patient Details  Name: Gregory Deleon MRN: [id number] Date of Birth: 1991/06/30 Referring Provider: Marton Redwood MD  Encounter Date: 02/14/2016      PT End of Session - 02/14/16 1157    Visit Number 11   Number of Visits 17   Date for PT Re-Evaluation 02/20/16   PT Start Time 1030   PT Stop Time 1115   PT Time Calculation (min) 45 min   Equipment Utilized During Treatment Gait belt   Activity Tolerance Patient tolerated treatment well   Behavior During Therapy [facility] for tasks assessed/performed      Past Medical History  Diagnosis Date  . Spinal cord injury, C5-C7 ([place])     incomplete    History reviewed. No pertinent past surgical history.  There were no vitals filed for this visit.      Subjective Assessment - 02/14/16 1027    Subjective Pt reports he is doing well on this date. He has no specific questions or concerns currently. Reports he continues to make progress with therapy.    Pertinent History personal factors affecting rehab: severity of injury   How long can you sit comfortably? NA, all day in wheelchair   How long can you stand comfortably? NA   How long can you walk comfortably? NA   Patient Stated Goals Work on floor transfers, hand dexterity;    Currently in Pain? Yes   Pain Score 5    Pain Location Hand   Pain Orientation Left   Pain Descriptors / Indicators Burning   Pain Type Chronic pain   Pain Onset More than a month ago         TREATMENT:  Ther-ex Leg press, BLE 75# x 10, 77.5# 2 x 10; with min A for maintain hip/knee position to avoid hip ER for better quad strengthening. Patient required min VCs to improve breath control for better quad muscle activation; Patient requires min A for scoot transfer to/from leg press machine and is max A for placing LE onto  leg press; Patient transferred to mat table, mod I with scoot transfer; Prone BLE glute squeeze 2 x 10 reps, 3 sec hold with min VCS for increase hold time to increase strengthening; PT assessed LE hamstring muscle activation. Patient unable to initiate hamstring activation in prone; Qped on elbow push ups 2 x 10; Practiced kneeling and tall kneeling on mat table with chair, +2 required to get into full tall kneeling position with heavy assist at posterior hips to get into full extension, static holds 4 x 15 seconds with min to modA+2 assist; Qped on elbows with alternating arm reaches with cues for weight shift, thoracic and cervical extension; Qped on elbows, Airex pads under elbows, with alternating arm reaches with cues for weight shift, thoracic and cervical extension; Sidelying on elbow static holds followed by lateral trunk extension to engage obliques, bilateral; Sidelying on elbow, Airex pad under elbow, static holds followed by lateral trunk extension to engage obliques, bilateral;                         PT Education - 02/14/16 1156    Education provided Yes   Education Details Reinforced plan of care   Person(s) Educated Patient   Methods Explanation   Comprehension Verbalized understanding  PT Long Term Goals - 02/05/16 1300    PT LONG TERM GOAL #1   Title Patient will be independent in home exercise program to improve strength/mobility for better functional independence with ADLs. by 02/20/16   Time 8   Period Weeks   Status On-going   PT LONG TERM GOAL #2   Title Patient will be mod I for floor to chair transfers to improve mobility at home by 02/20/16   Time 8   Period Weeks   Status Partially Met   PT LONG TERM GOAL #3   Title Patient will demonstrate improved upper back/core abdominal strength to 4/5 for increased trunk control with ADLs by 02/20/16   Time 8   Period Weeks   Status Partially Met               Plan -  02/14/16 1157    Clinical Impression Statement Pt demonstrates difficulty with unstable surfaces when qped on elbows. He is able to attain tall kneeling position with considerable assist and has significant bilateral hip extension weakness. Good oblique activation in sidelying. Pt encouraged to continue HEP and follow-up as scheduled.    Rehab Potential Good   Clinical Impairments Affecting Rehab Potential positive: motivated, young in age; negative: severity of injury; Patient's clinical presentation is stable.    PT Frequency 2x / week   PT Duration 8 weeks   PT Treatment/Interventions ADLs/Self Care Home Management;Cryotherapy;Electrical Stimulation;Moist Heat;Balance training;Therapeutic exercise;Therapeutic activities;Functional mobility training;DME Instruction;Neuromuscular re-education;Patient/family education;Wheelchair mobility training;Taping;Energy conservation   PT Next Visit Plan work on floor transfers, work on Nurse, mental health, work on UE strengthening   PT [place] continue as previously given   Consulted and Agree with Plan of Care Patient      Patient will benefit from skilled therapeutic intervention in order to improve the following deficits and impairments:  Decreased endurance, Hypomobility, Impaired tone, Decreased activity tolerance, Decreased strength, Pain, Impaired UE functional use, Decreased mobility, Improper body mechanics, Postural dysfunction, Decreased safety awareness  Visit Diagnosis: Weakness generalized  Impaired gait and mobility     Problem List There are no active problems to display for this patient.  Phillips Grout PT, DPT   Huprich,Jason 02/14/2016, 12:00 PM  [place] MAIN [facility] SERVICES [address] [place], [place], [address] Phone: [phone]   Fax:  [phone]  Name: Gregory Deleon MRN: [id number] Date of Birth: 11-03-1991

## 2016-02-17 ENCOUNTER — Encounter: Payer: Self-pay | Admitting: Physical Therapy

## 2016-02-17 ENCOUNTER — Ambulatory Visit: Payer: 59 | Admitting: Physical Therapy

## 2016-02-17 ENCOUNTER — Ambulatory Visit: Payer: 59 | Admitting: Occupational Therapy

## 2016-02-17 ENCOUNTER — Encounter: Payer: Self-pay | Admitting: Occupational Therapy

## 2016-02-17 DIAGNOSIS — R278 Other lack of coordination: Secondary | ICD-10-CM

## 2016-02-17 DIAGNOSIS — R2689 Other abnormalities of gait and mobility: Secondary | ICD-10-CM

## 2016-02-17 DIAGNOSIS — R531 Weakness: Secondary | ICD-10-CM

## 2016-02-17 DIAGNOSIS — R293 Abnormal posture: Secondary | ICD-10-CM

## 2016-02-17 NOTE — Therapy (Signed)
[place] MAIN [facility] SERVICES [address] [place], [place], [address] Phone: [phone]   Fax:  [phone]  Physical Therapy Treatment  Patient Details  Name: Gregory Deleon MRN: [id number] Date of Birth: 16-May-1991 Referring Provider: Marton Redwood MD  Encounter Date: 02/17/2016      PT End of Session - 02/17/16 1119    Visit Number 12   Number of Visits 17   Date for PT Re-Evaluation 02/20/16   PT Start Time 1025   PT Stop Time 1110   PT Time Calculation (min) 45 min   Equipment Utilized During Treatment Gait belt   Activity Tolerance Patient tolerated treatment well;No increased pain   Behavior During Therapy [facility] for tasks assessed/performed      Past Medical History  Diagnosis Date  . Spinal cord injury, C5-C7 ([place])     incomplete    History reviewed. No pertinent past surgical history.  There were no vitals filed for this visit.      Subjective Assessment - 02/17/16 1036    Subjective Patient reports doing okay; He reports having a good weekend; no new changes;    Pertinent History personal factors affecting rehab: severity of injury   How long can you sit comfortably? NA, all day in wheelchair   How long can you stand comfortably? NA   How long can you walk comfortably? NA   Patient Stated Goals Work on floor transfers, hand dexterity;    Currently in Pain? Yes   Pain Score 5    Pain Location Hand   Pain Orientation Left   Pain Descriptors / Indicators Burning   Pain Type Chronic pain   Pain Onset More than a month ago        TREATMENT: Prior to therapeutic activity:  Leg press, BLE  75# 2x10 with min A for maintain hip/knee position to avoid hip ER for better quad strengthening. Patient required min VCs to improve breath control for better quad muscle activation;  Patient requires min A to mod A  for scoot transfer to/from leg press machine and is max A for placing LE onto leg press;  Following exercise:   Patient transferred to mat table, mod I with scoot transfer; Patient rolled to stomach and slid into tall kneeling (supervision) Educated patient in floor transfer from tall kneeling to scooting forward on low mat table with cues for hand placement; Patient did require min A for lifting LE to facilitate better hip extension for increased ease of scooting forward on mat table; instructed patient to then roll towards back and then sit up on mat table; He has difficulty sitting up from supine and therefore rolled into sidelying and is min A for sidelying to sitting edge of mat table; Patient had significant fatigue in BUE following floor transfer; He would benefit from additional skilled treatment to work on floor to wheelchair transfer next session;                       PT Education - 02/17/16 1119    Education provided Yes   Education Details floor transfers, LE strengthening;    Person(s) Educated Patient   Methods Explanation;Verbal cues   Comprehension Verbalized understanding;Returned demonstration;Verbal cues required             PT Long Term Goals - 02/05/16 1300    PT LONG TERM GOAL #1   Title Patient will be independent in home exercise program to improve strength/mobility  for better functional independence with ADLs. by 02/20/16   Time 8   Period Weeks   Status On-going   PT LONG TERM GOAL #2   Title Patient will be mod I for floor to chair transfers to improve mobility at home by 02/20/16   Time 8   Period Weeks   Status Partially Met   PT LONG TERM GOAL #3   Title Patient will demonstrate improved upper back/core abdominal strength to 4/5 for increased trunk control with ADLs by 02/20/16   Time 8   Period Weeks   Status Partially Met               Plan - 02/17/16 1119    Clinical Impression Statement Patient exhibits increased weakness having difficulty with leg press exercise today; Patient reports feeling increased fatigue with exercise; He  required increased assistance with scoot transfer; Educated patient in floor transfer using small mat table. He required min A for LE positioning and then was able to roll into sitting on mat table. Patient would benefit from additional skilled PT intervention to improve LE strength, trunk stability and work on floor transfers;    Rehab Potential Good   Clinical Impairments Affecting Rehab Potential positive: motivated, young in age; negative: severity of injury; Patient's clinical presentation is stable.    PT Frequency 2x / week   PT Duration 8 weeks   PT Treatment/Interventions ADLs/Self Care Home Management;Cryotherapy;Electrical Stimulation;Moist Heat;Balance training;Therapeutic exercise;Therapeutic activities;Functional mobility training;DME Instruction;Neuromuscular re-education;Patient/family education;Wheelchair mobility training;Taping;Energy conservation   PT Next Visit Plan work on floor transfers, work on Nurse, mental health, work on UE strengthening   PT [place] continue as previously given   Consulted and Agree with Plan of Care Patient      Patient will benefit from skilled therapeutic intervention in order to improve the following deficits and impairments:  Decreased endurance, Hypomobility, Impaired tone, Decreased activity tolerance, Decreased strength, Pain, Impaired UE functional use, Decreased mobility, Improper body mechanics, Postural dysfunction, Decreased safety awareness  Visit Diagnosis: Weakness generalized  Impaired gait and mobility  Posture abnormality  Other lack of coordination     Problem List There are no active problems to display for this patient.   Trotter,Margaret PT, DPT 02/17/2016, 11:22 AM  [place] MAIN [facility] SERVICES [address] [place], [place], [address] Phone: [phone]   Fax:  [phone]  Name: Gregory Deleon MRN: [id number] Date of Birth: 05-31-1991

## 2016-02-18 NOTE — Therapy (Signed)
[place] MAIN [facility] SERVICES [address] [place], [place], [address] Phone: [phone]   Fax:  [phone]  Occupational Therapy Treatment  Patient Details  Name: Gregory Deleon MRN: [id number] Date of Birth: [birth year] Referring Provider: Brigitte Pulse  Encounter Date: 02/17/2016      OT End of Session - 02/17/16 1445    Visit Number 13   Number of Visits 16   Date for OT Re-Evaluation 02/19/16   OT Start Time 1110   OT Stop Time 1200   OT Time Calculation (min) 50 min   Activity Tolerance Patient tolerated treatment well   Behavior During Therapy [facility] for tasks assessed/performed      Past Medical History  Diagnosis Date  . Spinal cord injury, C5-C7 ([place])     incomplete    History reviewed. No pertinent past surgical history.  There were no vitals filed for this visit.      Subjective Assessment - 02/17/16 1111    Subjective  Patient reports he got his pump refilled with a small increase in the mornings.  Helped with easter egg hunt at church this weekend.     Patient Stated Goals Patient reports he would like to be as independent as possible.  Wants to either go back to school or get a job soon.    Currently in Pain? Yes   Pain Score 6    Pain Location Hand   Pain Orientation Left   Pain Descriptors / Indicators Burning   Pain Type Chronic pain   Pain Onset More than a month ago   Pain Frequency Constant                      OT Treatments/Exercises (OP) - 02/17/16 1602    ADLs   ADL Comments Patient seen for ADL training with adaptive equipment to assist with compensatory strategies to complete self-care tasks.   Patient instructed on long handled shoehorn to assist with donning and doffing shoes completed with verbal cues and setup.  Instructed on use of 2 types of buttonhooks for performing buttons on shirts.  Verbal and tactile cues required and multiple trials with each button hook to complete task.  Patient  instructed on use of rocker knife/fork combo for self feeding, patient able to demo use with cues and multiple trials however may do better with t bar rocker knife.                OT Education - 02/17/16 1444    Education provided Yes   Education Details use of A/E for self care tasks, buttonhook, combo rocker knife/fork, long shoe horn   Person(s) Educated Patient   Methods Explanation;Demonstration;Verbal cues;Tactile cues   Comprehension Verbalized understanding;Returned demonstration;Verbal cues required;Tactile cues required             OT Long Term Goals - 02/17/16 1502    OT LONG TERM GOAL #1   Title Patient will improve strength in LUE by 1 mm grade to assist with picking up items from the floor.     Baseline difficulty picking up items from floor at eval   Time 8   Period Weeks   Status On-going   OT LONG TERM GOAL #2   Title Patient will improve coordination to complete buttons with modified independence with good speed.    Baseline difficulty and takes increased time.   Time 8   Period Weeks   Status Partially Met   OT  LONG TERM GOAL #3   Title Patient will improve strength in bilateral UEs to don and doff socks independently.     Time 8   Period Weeks   Status Partially Met   OT LONG TERM GOAL #4   Title Patient will complete donning and doffing of compression garments with modified independence.    Time 8   Period Weeks   Status On-going   OT LONG TERM GOAL #5   Title Patient will improve coordination to tie shoes with modified independence.    Time 8   Status On-going               Plan - 02/17/16 1447    Clinical Impression Statement Patient able to demo use of adaptive equipment to increase independence with self care tasks, he may require additional instruction to become proficient in use.  Decreased grip bilaterall when using buttonhook but improved performance with use of large built up handle.  Patient able to demo rocker knife combo  but likely does best with a t bar type rocker knife.  Able to demo use of long handled shoehorn to doff shoes, issued preferred shoehorn for use at home.  He continues to benefit from skilled OT services to increase his ability to perform necessary daily tasks with greater independence.  Will continue to work towards improving strength, hand use and compensatory strategies to increase independence.    Rehab Potential Good   OT Frequency 2x / week   OT Duration 8 weeks   OT Treatment/Interventions Self-care/ADL training;Therapeutic exercise;Neuromuscular education;Therapeutic exercises;Patient/family education;DME and/or AE instruction;Therapeutic activities   Consulted and Agree with Plan of Care Patient      Patient will benefit from skilled therapeutic intervention in order to improve the following deficits and impairments:  Decreased coordination, Decreased range of motion, Decreased strength, Impaired UE functional use  Visit Diagnosis: Weakness generalized - Plan: Ot plan of care cert/re-cert  Other lack of coordination - Plan: Ot plan of care cert/re-cert    Problem List There are no active problems to display for this patient.  Achilles Dunk, OTR/L, CLT  Senica Crall 02/18/2016, 4:02 PM  [place] MAIN [facility] SERVICES [address] [place], [place], [address] Phone: [phone]   Fax:  [phone]  Name: DOMINIE BENEDICK MRN: [id number] Date of Birth: Jan 16, 1991

## 2016-02-19 ENCOUNTER — Encounter: Payer: Self-pay | Admitting: Physical Therapy

## 2016-02-19 ENCOUNTER — Ambulatory Visit: Payer: 59 | Admitting: Physical Therapy

## 2016-02-19 ENCOUNTER — Ambulatory Visit: Payer: 59 | Admitting: Occupational Therapy

## 2016-02-19 DIAGNOSIS — R279 Unspecified lack of coordination: Secondary | ICD-10-CM

## 2016-02-19 DIAGNOSIS — R293 Abnormal posture: Secondary | ICD-10-CM

## 2016-02-19 DIAGNOSIS — R531 Weakness: Secondary | ICD-10-CM | POA: Diagnosis not present

## 2016-02-19 DIAGNOSIS — R278 Other lack of coordination: Secondary | ICD-10-CM

## 2016-02-19 DIAGNOSIS — M6281 Muscle weakness (generalized): Secondary | ICD-10-CM

## 2016-02-19 NOTE — Therapy (Signed)
[place] MAIN [facility] SERVICES [address] [place], [place], [address] Phone: [phone]   Fax:  [phone]  Physical Therapy Treatment  Patient Details  Name: Gregory Deleon MRN: [id number] Date of Birth: 21-Oct-1991 Referring Provider: Marton Redwood MD  Encounter Date: 02/19/2016      PT End of Session - 02/19/16 1013    Visit Number 13   Number of Visits 25   Date for PT Re-Evaluation 03/18/16   PT Start Time 0930   PT Stop Time 1002   PT Time Calculation (min) 32 min   Equipment Utilized During Treatment Gait belt   Activity Tolerance Patient tolerated treatment well;No increased pain   Behavior During Therapy [facility] for tasks assessed/performed      Past Medical History  Diagnosis Date  . Spinal cord injury, C5-C7 ([place])     incomplete    History reviewed. No pertinent past surgical history.  There were no vitals filed for this visit.      Subjective Assessment - 02/19/16 0948    Subjective No new changes; He reports increased left hand pain today; Patient denies any new falls;    Pertinent History personal factors affecting rehab: severity of injury   How long can you sit comfortably? NA, all day in wheelchair   How long can you stand comfortably? NA   How long can you walk comfortably? NA   Patient Stated Goals Work on floor transfers, hand dexterity;    Currently in Pain? Yes   Pain Score 5    Pain Location Hand   Pain Orientation Left   Pain Descriptors / Indicators Burning   Pain Type Chronic pain   Pain Onset More than a month ago         TREATMENT:  Leg press, BLE 75# 2x12 with min A to avoid hip ER/abduction; Patient able to demonstrate better quad control with increased ROM and less fatigue;  Patient requires min A to mod A for scoot transfer to/from leg press machine and is max A for placing LE onto leg press;  Following exercise:  Patient transferred to mat table, mod I with scoot  transfer; Patient rolled to stomach and slid into tall kneeling (supervision) Educated patient in floor transfer from tall kneeling to scooting forward on wheelchair with cues for hand placement; Patient did require min A for lifting LE to facilitate better hip extension for increased ease of scooting forward on wheelchair and cues for reaching over back of chair for increased ability to pull forward; instructed patient to then roll towards side/back and sit up in chair; He was min-mod A for floor transfer with 2nd person to hold wheelchair during initial transfer. Patient able to demonstrate better hip control in tall kneeling as compared to previous sessions; He did require max A for LE placement towards chair prior to transfer;                          PT Education - 02/19/16 1013    Education provided Yes   Education Details Floor transfer, LE strengthening,recommendations;    Person(s) Educated Patient   Methods Explanation;Verbal cues   Comprehension Verbalized understanding;Returned demonstration;Verbal cues required             PT Long Term Goals - 02/19/16 1002    PT LONG TERM GOAL #1   Title Patient will be independent in home exercise program to improve strength/mobility for better functional independence  with ADLs. by 03/18/16   Time 8   Period Weeks   Status On-going   PT LONG TERM GOAL #2   Title Patient will be mod I for floor to chair transfers to improve mobility at home by 03/18/16   Time 8   Period Weeks   Status Partially Met   PT LONG TERM GOAL #3   Title Patient will demonstrate improved upper back/core abdominal strength to 4/5 for increased trunk control with ADLs by 03/18/16   Time 8   Period Weeks   Status Partially Met               Plan - 02/19/16 1014    Clinical Impression Statement Patient able to perform Leg press exercise better today as compared to previous sessions with increased quad control; He did require min A  for maintaining hip in neutral to avoid hip abduction/ER; Patient also instructed in floor transfer. He was min-mod A for lifting LE into position but was able to pull self forward on chair and turn around. He exhibits increased fatigue after 1 transfer. Patient would benefit from additional skilled PT Intervention to improve UE strength, trunk control for increased ease with floor transfers;    Rehab Potential Good   Clinical Impairments Affecting Rehab Potential positive: motivated, young in age; negative: severity of injury; Patient's clinical presentation is stable.    PT Frequency 2x / week   PT Duration 4 weeks   PT Treatment/Interventions ADLs/Self Care Home Management;Cryotherapy;Electrical Stimulation;Moist Heat;Balance training;Therapeutic exercise;Therapeutic activities;Functional mobility training;DME Instruction;Neuromuscular re-education;Patient/family education;Wheelchair mobility training;Taping;Energy conservation   PT Next Visit Plan work on floor transfers, work on Nurse, mental health, work on UE strengthening   PT [place] continue as previously given   Oncologist with Plan of Care Patient      Patient will benefit from skilled therapeutic intervention in order to improve the following deficits and impairments:  Decreased endurance, Hypomobility, Impaired tone, Decreased activity tolerance, Decreased strength, Pain, Impaired UE functional use, Decreased mobility, Improper body mechanics, Postural dysfunction, Decreased safety awareness  Visit Diagnosis: Muscle weakness (generalized) - Plan: PT plan of care cert/re-cert  Posture abnormality - Plan: PT plan of care cert/re-cert  Unspecified lack of coordination - Plan: PT plan of care cert/re-cert     Problem List There are no active problems to display for this patient.   Trotter,Margaret PT, DPT 02/19/2016, 10:17 AM  [place] MAIN [facility] SERVICES [address] [place], [place], [address] Phone: [phone]   Fax:  [phone]  Name: Gregory Deleon MRN: [id number] Date of Birth: 1991/06/06

## 2016-02-20 NOTE — Therapy (Signed)
[place] MAIN [facility] SERVICES [address] [place], [place], [address] Phone: [phone]   Fax:  [phone]  Occupational Therapy Treatment  Patient Details  Name: Gregory Deleon MRN: [id number] Date of Birth: 10-Dec-1990 Referring Provider: Brigitte Pulse  Encounter Date: 02/19/2016      OT End of Session - 02/20/16 1548    Visit Number 14   Number of Visits 32   Date for OT Re-Evaluation 02/19/16   OT Start Time 0840   OT Stop Time 0930   OT Time Calculation (min) 50 min   Activity Tolerance Patient tolerated treatment well   Behavior During Therapy [facility] for tasks assessed/performed      Past Medical History  Diagnosis Date  . Spinal cord injury, C5-C7 ([place])     incomplete    No past surgical history on file.  There were no vitals filed for this visit.      Subjective Assessment - 02/20/16 1543    Subjective  Patient reports he wants to try again with PT to work on getting up from the floor, reports he looked at a way on [employer] to get it done and wants to try that method.   Patient Stated Goals Patient reports he would like to be as independent as possible.  Wants to either go back to school or get a job soon.    Currently in Pain? Yes   Pain Score 5    Pain Location Hand   Pain Orientation Left   Pain Descriptors / Indicators Burning   Pain Type Chronic pain   Pain Onset More than a month ago   Pain Frequency Constant   Multiple Pain Sites No                      OT Treatments/Exercises (OP) - 02/20/16 1545    Fine Motor Coordination   Other Fine Motor Exercises Patient seen for focus on establishing prehension patterns with left hand to pick up and manipulate objects.  Cues for technique and minimizing of compensatory strategies for specific movements, reassessed coordination with 9 hole peg test this date and patient was able to complete on the right 39 secs and the left 2 minutes 32 secs compared to eval which  was over 4 minutes.  He reports he has never completed the test in under 4 minutes and pleased with score today.   Neurological Re-education Exercises   Other Exercises 1 Patient seen for focus on tricep extension exercises bilaterally but with more emphasis on left arm than right, facilitation techniques used in a variety of positions to work towards improved strength.                OT Education - 02/20/16 1548    Education provided Yes   Education Details HEP, coordination tasks, triceps   Person(s) Educated Patient   Methods Explanation;Demonstration;Verbal cues   Comprehension Verbal cues required;Returned demonstration;Verbalized understanding             OT Long Term Goals - 02/17/16 1502    OT LONG TERM GOAL #1   Title Patient will improve strength in LUE by 1 mm grade to assist with picking up items from the floor.     Baseline difficulty picking up items from floor at eval   Time 8   Period Weeks   Status On-going   OT LONG TERM GOAL #2   Title Patient will improve coordination to complete buttons with modified  independence with good speed.    Baseline difficulty and takes increased time.   Time 8   Period Weeks   Status Partially Met   OT LONG TERM GOAL #3   Title Patient will improve strength in bilateral UEs to don and doff socks independently.     Time 8   Period Weeks   Status Partially Met   OT LONG TERM GOAL #4   Title Patient will complete donning and doffing of compression garments with modified independence.    Time 8   Period Weeks   Status On-going   OT LONG TERM GOAL #5   Title Patient will improve coordination to tie shoes with modified independence.    Time 8   Status On-going               Plan - 02/20/16 1549    Clinical Impression Statement Patient has made significant progress with coordination of both hands since evaluation.  9 hole peg test reassessed this date with significant results and record numbers since having his  accident.  Will continue to work towards improved patterns of use with bilateral UEs for daily tasks.   Rehab Potential Good   OT Frequency 2x / week   OT Duration 8 weeks   OT Treatment/Interventions Self-care/ADL training;Therapeutic exercise;Neuromuscular education;Therapeutic exercises;Patient/family education;DME and/or AE instruction;Therapeutic activities   Consulted and Agree with Plan of Care Patient      Patient will benefit from skilled therapeutic intervention in order to improve the following deficits and impairments:  Decreased coordination, Decreased range of motion, Decreased strength, Impaired UE functional use  Visit Diagnosis: Muscle weakness (generalized)  Other lack of coordination    Problem List There are no active problems to display for this patient.  Achilles Dunk, OTR/L, CLT  Lovett,Amy 02/20/2016, 3:52 PM  [place] MAIN [facility] SERVICES [address] [place], [place], [address] Phone: [phone]   Fax:  [phone]  Name: Gregory Deleon MRN: [id number] Date of Birth: 02/21/1991

## 2016-02-21 ENCOUNTER — Encounter: Payer: 59 | Admitting: Occupational Therapy

## 2016-02-21 ENCOUNTER — Ambulatory Visit: Payer: 59 | Admitting: Physical Therapy

## 2016-02-25 ENCOUNTER — Encounter: Payer: Self-pay | Admitting: Physical Therapy

## 2016-02-25 ENCOUNTER — Ambulatory Visit: Payer: 59 | Admitting: Physical Therapy

## 2016-02-25 ENCOUNTER — Ambulatory Visit: Payer: 59 | Admitting: Occupational Therapy

## 2016-02-25 DIAGNOSIS — R531 Weakness: Secondary | ICD-10-CM | POA: Diagnosis not present

## 2016-02-25 DIAGNOSIS — M6281 Muscle weakness (generalized): Secondary | ICD-10-CM

## 2016-02-25 DIAGNOSIS — R278 Other lack of coordination: Secondary | ICD-10-CM

## 2016-02-25 DIAGNOSIS — R293 Abnormal posture: Secondary | ICD-10-CM

## 2016-02-25 NOTE — Therapy (Signed)
[place] MAIN [facility] SERVICES [address] [place], [place], [address] Phone: [phone]   Fax:  [phone]  Physical Therapy Treatment  Patient Details  Name: Gregory Deleon MRN: [id number] Date of Birth: [birth year] Referring Provider: Marton Redwood MD  Encounter Date: 02/25/2016      PT End of Session - 02/25/16 1050    Visit Number 14   Number of Visits 25   Date for PT Re-Evaluation 03/18/16   PT Start Time 0946   PT Stop Time 1030   PT Time Calculation (min) 44 min   Equipment Utilized During Treatment Gait belt   Activity Tolerance Patient tolerated treatment well;No increased pain   Behavior During Therapy [facility] for tasks assessed/performed      Past Medical History  Diagnosis Date  . Spinal cord injury, C5-C7 ([place])     incomplete    History reviewed. No pertinent past surgical history.  There were no vitals filed for this visit.      Subjective Assessment - 02/25/16 1033    Subjective Patient reports doing okay; He reports no new changes; Denies any new falls; Patient reports still exercising at the gym sometimes;    Pertinent History personal factors affecting rehab: severity of injury   How long can you sit comfortably? NA, all day in wheelchair   How long can you stand comfortably? NA   How long can you walk comfortably? NA   Patient Stated Goals Work on floor transfers, hand dexterity;    Currently in Pain? No/denies   Pain Onset More than a month ago         TREATMENT: Prior to exercise:  Patient transferred to mat table, mod I with scoot transfer; Patient rolled to stomach and slid into tall kneeling (supervision) Patient was educated in scooting on mat towards wheelchair to prepare for transfer. He was min A for placing LE into position;  Educated patient in floor transfer from tall kneeling to scooting forward on wheelchair with cues for hand placement; Patient did require min A for lifting LE to  facilitate better hip extension for increased ease of scooting forward on wheelchair and cues for reaching over back of chair for increased ability to pull forward; He did require min A for placing LE onto foot plate to improve hip position on wheelchair prior to transfer.  instructed patient to then roll towards side/back and sit up in chair; He was min-mod A for floor transfer with 2nd person to hold wheelchair during middle of transfer. Patient able to demonstrate better hip control in tall kneeling as compared to previous sessions;  Following floor transfer: Leg press, BLE 75# x15 with min A to avoid hip ER/abduction; Patient able to demonstrate better quad control with increased ROM and less fatigue;  Patient requires min A to mod A for scoot transfer to/from leg press machine and is max A for placing LE onto leg press;                           PT Education - 02/25/16 1050    Education provided Yes   Education Details floor transfer   Person(s) Educated Patient   Methods Explanation;Verbal cues   Comprehension Verbalized understanding;Returned demonstration;Verbal cues required             PT Long Term Goals - 02/19/16 1002    PT LONG TERM GOAL #1   Title Patient will be independent in  home exercise program to improve strength/mobility for better functional independence with ADLs. by 03/18/16   Time 8   Period Weeks   Status On-going   PT LONG TERM GOAL #2   Title Patient will be mod I for floor to chair transfers to improve mobility at home by 03/18/16   Time 8   Period Weeks   Status Partially Met   PT LONG TERM GOAL #3   Title Patient will demonstrate improved upper back/core abdominal strength to 4/5 for increased trunk control with ADLs by 03/18/16   Time 8   Period Weeks   Status Partially Met               Plan - 02/25/16 1050    Clinical Impression Statement Patient able to initiate scooting on floor today for better preparation  of floor transfer. He does better with positioning onto wheechair with knees on footplate as compared to when knees are on floor. Patient did require min -mod A for lifting LE into hip extension to facilitate better floor transfer; Patient continues to have weakness in UE having difficulty pulling into wheelchair. He would benefit from additional skilled PT Intervention to improve floor transfer and improve mobility;    Rehab Potential Good   Clinical Impairments Affecting Rehab Potential positive: motivated, young in age; negative: severity of injury; Patient's clinical presentation is stable.    PT Frequency 2x / week   PT Duration 4 weeks   PT Treatment/Interventions ADLs/Self Care Home Management;Cryotherapy;Electrical Stimulation;Moist Heat;Balance training;Therapeutic exercise;Therapeutic activities;Functional mobility training;DME Instruction;Neuromuscular re-education;Patient/family education;Wheelchair mobility training;Taping;Energy conservation   PT Next Visit Plan work on floor transfers, work on Nurse, mental health, work on UE strengthening   PT [place] continue as previously given   Consulted and Agree with Plan of Care Patient      Patient will benefit from skilled therapeutic intervention in order to improve the following deficits and impairments:  Decreased endurance, Hypomobility, Impaired tone, Decreased activity tolerance, Decreased strength, Pain, Impaired UE functional use, Decreased mobility, Improper body mechanics, Postural dysfunction, Decreased safety awareness  Visit Diagnosis: Muscle weakness (generalized)  Posture abnormality     Problem List There are no active problems to display for this patient.   Trotter,Margaret PT, DPT 02/25/2016, 10:53 AM  [place] MAIN [facility] SERVICES [address] [place], [place], [address] Phone: [phone]   Fax:  [phone]  Name: Gregory Deleon MRN: [id number] Date of  Birth: 1991/07/14

## 2016-02-26 ENCOUNTER — Encounter: Payer: Self-pay | Admitting: Occupational Therapy

## 2016-02-26 NOTE — Therapy (Signed)
[place] MAIN [facility] SERVICES [address] [place], [place], [address] Phone: [phone]   Fax:  [phone]  Occupational Therapy Treatment  Patient Details  Name: TOAN MORT MRN: [id number] Date of Birth: 1991-10-20 Referring Provider: Brigitte Pulse  Encounter Date: 02/25/2016      OT End of Session - 02/26/16 1735    Visit Number 15   Number of Visits 32   Date for OT Re-Evaluation 04/13/16   OT Start Time 1030   OT Stop Time 1120   OT Time Calculation (min) 50 min   Activity Tolerance Patient tolerated treatment well   Behavior During Therapy [facility] for tasks assessed/performed      Past Medical History  Diagnosis Date  . Spinal cord injury, C5-C7 ([place])     incomplete    History reviewed. No pertinent past surgical history.  There were no vitals filed for this visit.      Subjective Assessment - 02/26/16 1733    Subjective  Patient reports he  is tired from working with PT today, reports when he bends over to pick up items from the floor, he has a hard time sitting back up again, uses right hand to pick up most items.     Patient Stated Goals Patient reports he would like to be as independent as possible.  Wants to either go back to school or get a job soon.    Currently in Pain? Yes   Pain Score 5    Pain Location Hand   Pain Orientation Left   Pain Descriptors / Indicators Burning   Pain Type Chronic pain   Pain Onset More than a month ago   Pain Frequency Constant   Multiple Pain Sites No                      OT Treatments/Exercises (OP) - 02/26/16 1742    ADLs   ADL Comments Patient seen this date for focus on picking up items from the floor with both left and right hands.  Cues for moving wheelchair to strategic position for optimal recovery of items. 2 methods used, first method, hooking one arm around the back of chair for balance while using the opposite arm to reach down.  Second method was to bend  over forwards to pick up item with cues and occasional assist to push back up to sitting position.  Patient unable to pick up coins from the floor with left hand.  On right side patient is unable to pick up coins from tile floor (too slick) but able to pick up if there is a textured surface under the coins such as carpet or non skid pad. Variety of items used on both sides with a variety of  grasp patterns including a hook pattern with fingers.    Fine Motor Coordination   Other Fine Motor Exercises Patient seen for focus on establishing prehension patterns with left hand to pick up and manipulate objects.  Cues for technique and minimizing of compensatory strategies for specific movements, reassessed coordination with 9 hole peg test this date and patient was able to complete on the right 39 secs and the left 2 minutes 32 secs compared to eval which was over 4 minutes.  He reports he has never completed the test in under 4 minutes and pleased with score today.                OT Education - 02/26/16 1734  Education provided Yes   Education Details picking up items from the floor, HEP   Person(s) Educated Patient   Methods Explanation;Demonstration;Verbal cues   Comprehension Verbal cues required;Returned demonstration;Verbalized understanding             OT Long Term Goals - 02/17/16 1502    OT LONG TERM GOAL #1   Title Patient will improve strength in LUE by 1 mm grade to assist with picking up items from the floor.     Baseline difficulty picking up items from floor at eval   Time 8   Period Weeks   Status On-going   OT LONG TERM GOAL #2   Title Patient will improve coordination to complete buttons with modified independence with good speed.    Baseline difficulty and takes increased time.   Time 8   Period Weeks   Status Partially Met   OT LONG TERM GOAL #3   Title Patient will improve strength in bilateral UEs to don and doff socks independently.     Time 8   Period  Weeks   Status Partially Met   OT LONG TERM GOAL #4   Title Patient will complete donning and doffing of compression garments with modified independence.    Time 8   Period Weeks   Status On-going   OT LONG TERM GOAL #5   Title Patient will improve coordination to tie shoes with modified independence.    Time 8   Status On-going               Plan - 02/26/16 1736    Clinical Impression Statement Patient demos difficulty with picking up items from the floor, one issue is technique and second issue is lack of manipulation skills on both right and left hands although right hand is able to pick up select items. Patient fatigues quickly with multiple repetitions of task and requires rest breaks.  Cues to move wheelchair to a strategic spot to be able to pick up items with greater independence.     Rehab Potential Good   OT Frequency 2x / week   OT Duration 8 weeks   OT Treatment/Interventions Self-care/ADL training;Therapeutic exercise;Neuromuscular education;Therapeutic exercises;Patient/family education;DME and/or AE instruction;Therapeutic activities   Consulted and Agree with Plan of Care Patient      Patient will benefit from skilled therapeutic intervention in order to improve the following deficits and impairments:  Decreased coordination, Decreased range of motion, Decreased strength, Impaired UE functional use  Visit Diagnosis: Muscle weakness (generalized)  Other lack of coordination    Problem List There are no active problems to display for this patient.  Achilles Dunk, OTR/L, CLT  Chalmers Iddings 02/26/2016, 5:47 PM  [place] MAIN [facility] SERVICES [address] [place], [place], [address] Phone: [phone]   Fax:  [phone]  Name: LINFORD QUINTELA MRN: [id number] Date of Birth: December 30, 1990

## 2016-02-27 ENCOUNTER — Ambulatory Visit: Payer: 59 | Admitting: Occupational Therapy

## 2016-02-27 ENCOUNTER — Ambulatory Visit: Payer: 59 | Admitting: Physical Therapy

## 2016-02-27 ENCOUNTER — Encounter: Payer: Self-pay | Admitting: Physical Therapy

## 2016-02-27 ENCOUNTER — Encounter: Payer: Self-pay | Admitting: Occupational Therapy

## 2016-02-27 DIAGNOSIS — R278 Other lack of coordination: Secondary | ICD-10-CM

## 2016-02-27 DIAGNOSIS — M6281 Muscle weakness (generalized): Secondary | ICD-10-CM

## 2016-02-27 DIAGNOSIS — R531 Weakness: Secondary | ICD-10-CM | POA: Diagnosis not present

## 2016-02-27 DIAGNOSIS — R293 Abnormal posture: Secondary | ICD-10-CM

## 2016-02-27 NOTE — Therapy (Signed)
[place] MAIN [facility] SERVICES [address] [place], [place], [address] Phone: [phone]   Fax:  [phone]  Physical Therapy Treatment  Patient Details  Name: Gregory Deleon MRN: [id number] Date of Birth: 1991/09/17 Referring Provider: Marton Redwood MD  Encounter Date: 02/27/2016      PT End of Session - 02/27/16 1340    Visit Number 15   Number of Visits 25   Date for PT Re-Evaluation 03/18/16   PT Start Time 0802   PT Stop Time 0840   PT Time Calculation (min) 38 min   Equipment Utilized During Treatment Gait belt   Activity Tolerance Patient tolerated treatment well;No increased pain   Behavior During Therapy [facility] for tasks assessed/performed      Past Medical History  Diagnosis Date  . Spinal cord injury, C5-C7 ([place])     incomplete    History reviewed. No pertinent past surgical history.  There were no vitals filed for this visit.      Subjective Assessment - 02/27/16 0807    Subjective Patient reports doing okay today; He denies any new changes; Reports continued hand pain that doesn't change;    Pertinent History personal factors affecting rehab: severity of injury   How long can you sit comfortably? NA, all day in wheelchair   How long can you stand comfortably? NA   How long can you walk comfortably? NA   Patient Stated Goals Work on floor transfers, hand dexterity;    Currently in Pain? Yes   Pain Score 5    Pain Location Hand   Pain Orientation Left   Pain Descriptors / Indicators Burning   Pain Type Chronic pain   Pain Onset More than a month ago      TREATMENT:   Leg press, BLE 75# 2x15 with supervision to avoid hip ER/abduction at first set and then min-mod A 2nd set due to fatigue; Patient able to demonstrate better quad control with increased ROM and less fatigue;  Patient requires min A to mod A for scoot transfer to/from leg press machine and is max A for placing LE onto leg press;  Patient  prone: Press up on elbows, scapular protraction 2x15 reps; Press up into qped position with cues to push with arms and walk forward/backward on elbows x3 reps; In qped, trying to adjust from on elbows to on hands x2 reps with mod Vcs and min A to try and avoid leaning on head and rather shift weight side/side;  Patient reports increased fatigue with qped exercise; Instructed patient to add moving from prone to qped position x5 reps at home as part of HEP to work on weight bearing through UE for better strengthening;  Patient is min A for rolling supine and getting into sitting position at end of treatment session; He is mod I with scoot transfer to wheelchair when going from elevated surface to lower surface;                          PT Education - 02/27/16 1340    Education provided Yes   Education Details HEP advanced, positioning   Person(s) Educated Patient   Methods Explanation;Verbal cues   Comprehension Verbalized understanding;Returned demonstration;Verbal cues required             PT Long Term Goals - 02/19/16 1002    PT LONG TERM GOAL #1   Title Patient will be independent in home exercise program  to improve strength/mobility for better functional independence with ADLs. by 03/18/16   Time 8   Period Weeks   Status On-going   PT LONG TERM GOAL #2   Title Patient will be mod I for floor to chair transfers to improve mobility at home by 03/18/16   Time 8   Period Weeks   Status Partially Met   PT LONG TERM GOAL #3   Title Patient will demonstrate improved upper back/core abdominal strength to 4/5 for increased trunk control with ADLs by 03/18/16   Time 8   Period Weeks   Status Partially Met               Plan - 02/27/16 1341    Clinical Impression Statement Instructed patient in advanced strengthening, working in qped position for increased weight bearing through [place]; Patient able to transition from prone to qped supervision requiring  assistance to block feet to avoid sliding back on treatment table. Patient reports increased fatigue at end of treatment session; He would benefit from additional skilled PT intervention to improve functional mobility and strength.    Rehab Potential Good   Clinical Impairments Affecting Rehab Potential positive: motivated, young in age; negative: severity of injury; Patient's clinical presentation is stable.    PT Frequency 2x / week   PT Duration 4 weeks   PT Treatment/Interventions ADLs/Self Care Home Management;Cryotherapy;Electrical Stimulation;Moist Heat;Balance training;Therapeutic exercise;Therapeutic activities;Functional mobility training;DME Instruction;Neuromuscular re-education;Patient/family education;Wheelchair mobility training;Taping;Energy conservation   PT Next Visit Plan work on floor transfers, work on Nurse, mental health, work on UE strengthening   PT [place] continue as previously given   Consulted and Agree with Plan of Care Patient      Patient will benefit from skilled therapeutic intervention in order to improve the following deficits and impairments:  Decreased endurance, Hypomobility, Impaired tone, Decreased activity tolerance, Decreased strength, Pain, Impaired UE functional use, Decreased mobility, Improper body mechanics, Postural dysfunction, Decreased safety awareness  Visit Diagnosis: Muscle weakness (generalized)  Posture abnormality  Other lack of coordination     Problem List There are no active problems to display for this patient.   Jettie Mannor PT, DPT 02/27/2016, 1:44 PM  [place] MAIN [facility] SERVICES [address] [place], [place], [address] Phone: [phone]   Fax:  [phone]  Name: Gregory Deleon MRN: [id number] Date of Birth: 05-10-1991

## 2016-02-28 ENCOUNTER — Ambulatory Visit: Payer: 59 | Admitting: Physical Therapy

## 2016-02-28 ENCOUNTER — Encounter: Payer: 59 | Admitting: Occupational Therapy

## 2016-02-28 NOTE — Therapy (Signed)
[place] MAIN [facility] SERVICES [address] [place], [place], [address] Phone: [phone]   Fax:  [phone]  Occupational Therapy Treatment  Patient Details  Name: Gregory Deleon MRN: [id number] Date of Birth: [birth year] Referring Provider: Brigitte Pulse  Encounter Date: 02/27/2016      OT End of Session - 02/28/16 1038    Visit Number 16   Number of Visits 32   Date for OT Re-Evaluation 04/13/16   OT Start Time 0915   OT Stop Time 1002   OT Time Calculation (min) 47 min   Activity Tolerance Patient tolerated treatment well   Behavior During Therapy [facility] for tasks assessed/performed      Past Medical History  Diagnosis Date  . Spinal cord injury, C5-C7 ([place])     incomplete    History reviewed. No pertinent past surgical history.  There were no vitals filed for this visit.      Subjective Assessment - 02/27/16 0383    Subjective  Patient reports he will not be able to continue therapy into May, will need to finish up with sessions by the end of next week.  Wants to continue to focus on hand skills and manipulation of objects in addition to strength.   Patient Stated Goals Patient reports he would like to be as independent as possible.  Wants to either go back to school or get a job soon.    Currently in Pain? Yes   Pain Score 6    Pain Location Hand   Pain Orientation Left   Pain Descriptors / Indicators Burning   Pain Type Chronic pain                      OT Treatments/Exercises (OP) - 02/27/16 1040    ADLs   Writing Patient seen for handwriting this date with focus on grasp and isolated finger movements to write phone number and name.   Fine Motor Coordination   Other Fine Motor Exercises Patient seen for focus on establishing prehension patterns with left hand to pick up and manipulate objects, performed nuts and bolts this date from elevated vertical position.  Cues for technique and minimizing of compensatory  strategies for specific movements.  Manipulation of [place] discs with right and left hand with cues for technique and patterns for finger movements to produce turning of object.                 OT Education - 02/28/16 1038    Education provided Yes   Education Details HEP, coordination   Person(s) Educated Patient   Methods Explanation;Demonstration;Verbal cues   Comprehension Verbal cues required;Returned demonstration;Verbalized understanding             OT Long Term Goals - 02/17/16 1502    OT LONG TERM GOAL #1   Title Patient will improve strength in LUE by 1 mm grade to assist with picking up items from the floor.     Baseline difficulty picking up items from floor at eval   Time 8   Period Weeks   Status On-going   OT LONG TERM GOAL #2   Title Patient will improve coordination to complete buttons with modified independence with good speed.    Baseline difficulty and takes increased time.   Time 8   Period Weeks   Status Partially Met   OT LONG TERM GOAL #3   Title Patient will improve strength in bilateral UEs to don and doff  socks independently.     Time 8   Period Weeks   Status Partially Met   OT LONG TERM GOAL #4   Title Patient will complete donning and doffing of compression garments with modified independence.    Time 8   Period Weeks   Status On-going   OT LONG TERM GOAL #5   Title Patient will improve coordination to tie shoes with modified independence.    Time 8   Status On-going               Plan - 02/28/16 1039    Clinical Impression Statement Patient will only have one more week of therapy since he states he will be unable to continue after next week due to attempting to transition to school/work tasks with assistance from vocational rehabilitation.  He has continued to make progress with use of bilateral UEs for tasks, especially with left hand and manipulation of objects.  Will continue to work the next 2 sessions towards goals  and prepare patient for discharge with comprehensive home program.    Rehab Potential Good   OT Frequency 2x / week   OT Duration 8 weeks   OT Treatment/Interventions Self-care/ADL training;Therapeutic exercise;Neuromuscular education;Therapeutic exercises;Patient/family education;DME and/or AE instruction;Therapeutic activities   Consulted and Agree with Plan of Care Patient      Patient will benefit from skilled therapeutic intervention in order to improve the following deficits and impairments:  Decreased coordination, Decreased range of motion, Decreased strength, Impaired UE functional use  Visit Diagnosis: Muscle weakness (generalized)  Other lack of coordination    Problem List There are no active problems to display for this patient.  Achilles Dunk, OTR/L, CLT  Waylon Koffler 02/28/2016, 10:45 AM  [place] MAIN [facility] SERVICES [address] [place], [place], [address] Phone: [phone]   Fax:  [phone]  Name: BANYAN GOODCHILD MRN: [id number] Date of Birth: 1991/07/04

## 2016-03-02 ENCOUNTER — Encounter: Payer: Self-pay | Admitting: Physical Therapy

## 2016-03-02 ENCOUNTER — Encounter: Payer: Self-pay | Admitting: Occupational Therapy

## 2016-03-02 ENCOUNTER — Ambulatory Visit: Payer: 59 | Admitting: Occupational Therapy

## 2016-03-02 ENCOUNTER — Ambulatory Visit: Payer: 59 | Admitting: Physical Therapy

## 2016-03-02 DIAGNOSIS — R293 Abnormal posture: Secondary | ICD-10-CM

## 2016-03-02 DIAGNOSIS — R278 Other lack of coordination: Secondary | ICD-10-CM

## 2016-03-02 DIAGNOSIS — R531 Weakness: Secondary | ICD-10-CM | POA: Diagnosis not present

## 2016-03-02 DIAGNOSIS — M6281 Muscle weakness (generalized): Secondary | ICD-10-CM

## 2016-03-02 NOTE — Therapy (Signed)
[place] MAIN [facility] SERVICES [address] [place], [place], [address] Phone: [phone]   Fax:  [phone]  Occupational Therapy Treatment  Patient Details  Name: Gregory Deleon MRN: [id number] Date of Birth: 11/24/1990 Referring Provider: Brigitte Pulse  Encounter Date: 03/02/2016      OT End of Session - 03/02/16 1557    Visit Number 17   Number of Visits 32   Date for OT Re-Evaluation 04/13/16   OT Start Time 0930   OT Stop Time 1015   OT Time Calculation (min) 45 min   Activity Tolerance Patient tolerated treatment well   Behavior During Therapy [facility] for tasks assessed/performed      Past Medical History  Diagnosis Date  . Spinal cord injury, C5-C7 ([place])     incomplete    History reviewed. No pertinent past surgical history.  There were no vitals filed for this visit.      Subjective Assessment - 03/02/16 1551    Subjective  Patient reports he is trying to work with vocational rehab to either return to work or go back to school.  He is also in need of being evaluated by driving specialist for return to driving with implementation of vehicle controls.    Patient Stated Goals Patient reports he would like to be as independent as possible.  Wants to either go back to school or get a job soon.    Currently in Pain? Yes   Pain Score 7    Pain Location Hand   Pain Orientation Left   Pain Descriptors / Indicators Burning   Pain Type Chronic pain   Pain Onset More than a month ago   Pain Frequency Constant   Multiple Pain Sites No                      OT Treatments/Exercises (OP) - 03/02/16 1553    Fine Motor Coordination   Other Fine Motor Exercises Patient seen for manipulation of graded sized objects with use of left hand and placing into grid.  Cues for using thumb and index finger to pick up to work towards improved hand skills.    Neurological Re-education Exercises   Other Exercises 1 Patient seen for pinch  skills on left hand yellow pins only with lateral pinch and placing onto elevated surface.  Right hand able to perform yellow, red, green but unable to perform levels 4 and 5 pins.  Triceps press overhead with right UE for 2 sets 10 reps, forwards press 2 sets 10 reps, downward press for 2 sets of 10 reps, verbal and tactile cues use as well as tapping methods to obtain muscle activation.                OT Education - 03/02/16 1557    Education provided Yes   Education Details ROM, strengthening, coordination tasks.   Person(s) Educated Patient   Methods Explanation;Demonstration;Verbal cues   Comprehension Verbal cues required;Returned demonstration;Verbalized understanding             OT Long Term Goals - 02/17/16 1502    OT LONG TERM GOAL #1   Title Patient will improve strength in LUE by 1 mm grade to assist with picking up items from the floor.     Baseline difficulty picking up items from floor at eval   Time 8   Period Weeks   Status On-going   OT LONG TERM GOAL #2   Title Patient will  improve coordination to complete buttons with modified independence with good speed.    Baseline difficulty and takes increased time.   Time 8   Period Weeks   Status Partially Met   OT LONG TERM GOAL #3   Title Patient will improve strength in bilateral UEs to don and doff socks independently.     Time 8   Period Weeks   Status Partially Met   OT LONG TERM GOAL #4   Title Patient will complete donning and doffing of compression garments with modified independence.    Time 8   Period Weeks   Status On-going   OT LONG TERM GOAL #5   Title Patient will improve coordination to tie shoes with modified independence.    Time 8   Status On-going               Plan - 03/02/16 1558    Clinical Impression Statement Patient able to demo exercises with assist in the clinic, he may need his family to assist with exercises once he is discharged from OP this week.  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  He would like to get back to driving as soon as possible but will require further testing with driving specialist and be fitted and equipped with controls.  Will see patient for one more session and prepare for discharge.   Rehab Potential Good   OT Frequency 2x / week   OT Duration 8 weeks   OT Treatment/Interventions Self-care/ADL training;Therapeutic exercise;Neuromuscular education;Therapeutic exercises;Patient/family education;DME and/or AE instruction;Therapeutic activities   Consulted and Agree with Plan of Care Patient      Patient will benefit from skilled therapeutic intervention in order to improve the following deficits and impairments:  Decreased coordination, Decreased range of motion, Decreased strength, Impaired UE functional use  Visit Diagnosis: Muscle weakness (generalized)  Other lack of coordination    Problem List There are no active problems to display for this patient.  Achilles Dunk, OTR/L, CLT  Lovett,Amy 03/02/2016, 4:02 PM  [place] MAIN [facility] SERVICES [address] [place], [place], [address] Phone: [phone]   Fax:  [phone]  Name: Gregory Deleon MRN: [id number] Date of Birth: 09-20-1991

## 2016-03-02 NOTE — Therapy (Signed)
[place] [facility] MAIN REHAB SERVICES [address] [place], [place], [address] Phone: [phone]   Fax:  [phone]  Physical Therapy Treatment  Patient Details  Name: Gregory Deleon MRN: [id number] Date of Birth: 09/29/1991 Referring Provider: William Shaw MD  Encounter Date: 03/02/2016      PT End of Session - 03/02/16 0957    Visit Number 16   Number of Visits 25   Date for PT Re-Evaluation 03/18/16   PT Start Time 0845   PT Stop Time 0927   PT Time Calculation (min) 42 min   Equipment Utilized During Treatment Gait belt   Activity Tolerance Patient tolerated treatment well;No increased pain   Behavior During Therapy WFL for tasks assessed/performed      Past Medical History  Diagnosis Date  . Spinal cord injury, C5-C7 ([facility])     incomplete    History reviewed. No pertinent past surgical history.  There were no vitals filed for this visit.      Subjective Assessment - 03/02/16 0935    Subjective Patient reports doing well; No new changes;    Pertinent History personal factors affecting rehab: severity of injury   How long can you sit comfortably? NA, all day in wheelchair   How long can you stand comfortably? NA   How long can you walk comfortably? NA   Patient Stated Goals Work on floor transfers, hand dexterity;    Currently in Pain? Yes   Pain Score 5    Pain Location Hand   Pain Orientation Left   Pain Type Chronic pain   Pain Onset More than a month ago         TREATMENT: Leg press, BLE 90# x10 with supervision to avoid hip ER/abduction at first set and then min-mod A 2nd set due to fatigue; Patient able to demonstrate better quad control with increased ROM and less fatigue; RLE heel raises on leg press, 15# x10, partial ROM; Patient able to exhibit calf muscle activation through partial ROM; unable to perform on LLE; Patient requires min A to mod A for scoot transfer to/from leg press machine and is max A for  placing LE onto leg press;   Instructed patient in pull ups on matrix bar (full weight attached to reduce bar movement) x5 reps, over and under hand with bar above head (level 16), x5 reps (over and underhand) with bar at head height, level 13;  Patient required min A to keep hooks on bar and to increase pull through arms for better hip clearance on chair;  Also instructed patient in elbows and shoulders over pull-up bar working on increasing lat strength for better ability to pull self up in chair x3 reps;  Patient prone on mat table, pushing through BUE into qped position on elbows x5 reps with supervision and min VCs to improve hip control for better balance and trunk control;  Patient fatigues after treatment session with prolonged UE movement. He was instructed in ways to work on pulling up at gym for UE strengthening and qped positioning for UE strengthening;                          PT Education - 03/02/16 0956    Education provided Yes   Education Details HEP reinforced; strengthening   Person(s) Educated Patient   Methods Explanation;Verbal cues   Comprehension Verbalized understanding;Returned demonstration;Verbal cues required               PT Long Term Goals - 02/19/16 1002    PT LONG TERM GOAL #1   Title Patient will be independent in home exercise program to improve strength/mobility for better functional independence with ADLs. by 03/18/16   Time 8   Period Weeks   Status On-going   PT LONG TERM GOAL #2   Title Patient will be mod I for floor to chair transfers to improve mobility at home by 03/18/16   Time 8   Period Weeks   Status Partially Met   PT LONG TERM GOAL #3   Title Patient will demonstrate improved upper back/core abdominal strength to 4/5 for increased trunk control with ADLs by 03/18/16   Time 8   Period Weeks   Status Partially Met               Plan - 03/02/16 0957    Clinical Impression Statement Instructed patient in  LE and UE strengthening; Patient able to progress with increased weight on leg press for better LE strengthening; Patient also instructed in bar pull up to improve UE strength and mobility; Patient was instructed in HEP including pull ups and qped positioning. He would benefit from additional skilled PT Intervention to improve UE/LE strength and improve functional mobility;    Rehab Potential Good   Clinical Impairments Affecting Rehab Potential positive: motivated, young in age; negative: severity of injury; Patient's clinical presentation is stable.    PT Frequency 2x / week   PT Duration 4 weeks   PT Treatment/Interventions ADLs/Self Care Home Management;Cryotherapy;Electrical Stimulation;Moist Heat;Balance training;Therapeutic exercise;Therapeutic activities;Functional mobility training;DME Instruction;Neuromuscular re-education;Patient/family education;Wheelchair mobility training;Taping;Energy conservation   PT Next Visit Plan work on floor transfers, work on tipping wheelchair, work on UE strengthening   PT Home Exercise Plan continue as previously given   Consulted and Agree with Plan of Care Patient      Patient will benefit from skilled therapeutic intervention in order to improve the following deficits and impairments:  Decreased endurance, Hypomobility, Impaired tone, Decreased activity tolerance, Decreased strength, Pain, Impaired UE functional use, Decreased mobility, Improper body mechanics, Postural dysfunction, Decreased safety awareness  Visit Diagnosis: Muscle weakness (generalized)  Other lack of coordination  Posture abnormality     Problem List There are no active problems to display for this patient.   Trotter,Margaret PT, DPT 03/02/2016, 9:59 AM  [place] [facility] MA[facility] REHAB SERVICES [address] [place], [place], [address] Phone: [phone]   Fax:  [phone]  Name: Gregory Deleon MRN: [id number] Date of Birth:  03/21/1991     

## 2016-03-04 ENCOUNTER — Ambulatory Visit: Payer: 59 | Admitting: Physical Therapy

## 2016-03-04 ENCOUNTER — Ambulatory Visit: Payer: 59 | Admitting: Occupational Therapy

## 2016-03-04 ENCOUNTER — Encounter: Payer: Self-pay | Admitting: Physical Therapy

## 2016-03-04 DIAGNOSIS — R278 Other lack of coordination: Secondary | ICD-10-CM

## 2016-03-04 DIAGNOSIS — M6281 Muscle weakness (generalized): Secondary | ICD-10-CM

## 2016-03-04 DIAGNOSIS — R293 Abnormal posture: Secondary | ICD-10-CM

## 2016-03-04 DIAGNOSIS — R531 Weakness: Secondary | ICD-10-CM | POA: Diagnosis not present

## 2016-03-04 NOTE — Therapy (Signed)
[place] MAIN [facility] SERVICES [address] [place], [place], [address] Phone: [phone]   Fax:  [phone]  Physical Therapy Treatment/Discharge Summary  Patient Details  Name: Gregory Deleon MRN: [id number] Date of Birth: 06/08/1991 Referring Provider: Marton Redwood MD  Encounter Date: 03/04/2016      PT End of Session - 03/04/16 1332    Visit Number 17   Number of Visits 25   Date for PT Re-Evaluation 03/18/16   PT Start Time 1030   PT Stop Time 1115   PT Time Calculation (min) 45 min   Equipment Utilized During Treatment Gait belt   Activity Tolerance Patient tolerated treatment well;No increased pain   Behavior During Therapy [facility] for tasks assessed/performed      Past Medical History  Diagnosis Date  . Spinal cord injury, C5-C7 ([place])     incomplete    History reviewed. No pertinent past surgical history.  There were no vitals filed for this visit.      Subjective Assessment - 03/04/16 1326    Subjective Patient reports no new changes. He reports that he has not fallen out of his chair in a while and therefore has not practiced trying to get up at home; He reports trying qped positioning on bed with success;   Pertinent History personal factors affecting rehab: severity of injury   How long can you sit comfortably? NA, all day in wheelchair   How long can you stand comfortably? NA   How long can you walk comfortably? NA   Patient Stated Goals Work on floor transfers, hand dexterity;    Currently in Pain? Yes   Pain Score 5    Pain Location Hand   Pain Orientation Left   Pain Descriptors / Indicators Burning   Pain Type Chronic pain   Pain Onset More than a month ago        TREATMENT: Prior to floor transfer: Leg press, BLE 90# 2x10 with supervision to avoid hip ER/abduction ; Patient able to demonstrate better quad control with increased ROM and less fatigue;  Patient requires min A to mod A for scoot  transfer to/from leg press machine and is max A for placing LE onto leg press;   Following exercise: Patient instructed in floor transfer; He required mod A to control lower to floor out of chair; He was then instructed to roll into prone and press up into qped; Patient required mod A for holding into qped/tall kneeling while trying to get arms into chair; Patient was then instructed to pull self up on chair; He was max A for putting knee on foot plate; Patient very fatigued after getting into tall kneeling over chair and had difficulty shifting weight and pulling into sitting on chair; He was then transferred to chair, supine, with 2 person assist (max A) to wheelchair while lying down and then is dependent for sitting up in chair;  Patient heavily fatigued after treatment session; Reinforced HEP with instruction to perform qped positioning at home and work on pulling up on couch/bed for increased transfer mobility;                          PT Education - 03/04/16 1332    Education provided Yes   Education Details floor transfers, strengthening   Person(s) Educated Patient   Methods Explanation;Verbal cues;Demonstration;Tactile cues   Comprehension Verbalized understanding;Returned demonstration;Verbal cues required;Tactile cues required  PT Long Term Goals - 03/04/16 1336    PT LONG TERM GOAL #1   Title Patient will be independent in home exercise program to improve strength/mobility for better functional independence with ADLs. by 03/18/16   Time 8   Period Weeks   Status Achieved   PT LONG TERM GOAL #2   Title Patient will be mod I for floor to chair transfers to improve mobility at home by 03/18/16   Time 8   Period Weeks   Status Partially Met   PT LONG TERM GOAL #3   Title Patient will demonstrate improved upper back/core abdominal strength to 4/5 for increased trunk control with ADLs by 03/18/16   Time 8   Period Weeks   Status Partially  Met               Plan - 03/04/16 1333    Clinical Impression Statement Instructed patient in LE strengthening on leg press; Patient able to perform increased repetition at higher weight demonstrating improved quad strength; PT instructed patient in floor transfer; he had increased difficulty rolling into qped while preparing for getting into wheelchair; He is max A for placing LE on foot plate; Patient was very fatigued and unable to getting into wheelchair with floor transfer without max A +2; While patient has been unable to perform a floor transfer independently he has gained better UE/LE strength and better functional mobility; Patient is planning on pursuing vocational rehab for work/school and is requesting discharge from therapy at this time;    Rehab Potential Good   Clinical Impairments Affecting Rehab Potential positive: motivated, young in age; negative: severity of injury; Patient's clinical presentation is stable.    PT Frequency 2x / week   PT Duration 4 weeks   PT Treatment/Interventions ADLs/Self Care Home Management;Cryotherapy;Electrical Stimulation;Moist Heat;Balance training;Therapeutic exercise;Therapeutic activities;Functional mobility training;DME Instruction;Neuromuscular re-education;Patient/family education;Wheelchair mobility training;Taping;Energy conservation   PT Next Visit Plan work on floor transfers, work on Nurse, mental health, work on UE strengthening   PT [place] continue as previously given   Consulted and Agree with Plan of Care Patient      Patient will benefit from skilled therapeutic intervention in order to improve the following deficits and impairments:  Decreased endurance, Hypomobility, Impaired tone, Decreased activity tolerance, Decreased strength, Pain, Impaired UE functional use, Decreased mobility, Improper body mechanics, Postural dysfunction, Decreased safety awareness  Visit Diagnosis: Muscle weakness (generalized)  Other  lack of coordination  Posture abnormality     Problem List There are no active problems to display for this patient.   Agnieszka Newhouse PT, DPT 03/04/2016, 1:37 PM  [place] MAIN [facility] SERVICES [address] [place], [place], [address] Phone: [phone]   Fax:  [phone]  Name: Gregory Deleon MRN: [id number] Date of Birth: 23-Nov-1990

## 2016-03-06 ENCOUNTER — Ambulatory Visit: Payer: 59 | Admitting: Physical Therapy

## 2016-03-06 ENCOUNTER — Encounter: Payer: 59 | Admitting: Occupational Therapy
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
## 2016-10-27 DIAGNOSIS — Z9889 Other specified postprocedural states: Secondary | ICD-10-CM | POA: Diagnosis not present

## 2016-10-27 DIAGNOSIS — G8921 Chronic pain due to trauma: Secondary | ICD-10-CM | POA: Diagnosis not present

## 2016-10-27 DIAGNOSIS — Z5181 Encounter for therapeutic drug level monitoring: Secondary | ICD-10-CM | POA: Diagnosis not present

## 2016-10-27 DIAGNOSIS — Z79891 Long term (current) use of opiate analgesic: Secondary | ICD-10-CM | POA: Diagnosis not present

## 2016-10-27 DIAGNOSIS — R252 Cramp and spasm: Secondary | ICD-10-CM | POA: Diagnosis not present

## 2016-11-11 DIAGNOSIS — Z6826 Body mass index (BMI) 26.0-26.9, adult: Secondary | ICD-10-CM | POA: Diagnosis not present

## 2016-11-11 DIAGNOSIS — G8250 Quadriplegia, unspecified: Secondary | ICD-10-CM | POA: Diagnosis not present

## 2016-11-11 DIAGNOSIS — N319 Neuromuscular dysfunction of bladder, unspecified: Secondary | ICD-10-CM | POA: Diagnosis not present

## 2017-01-25 DIAGNOSIS — N312 Flaccid neuropathic bladder, not elsewhere classified: Secondary | ICD-10-CM | POA: Diagnosis not present

## 2017-03-07 ENCOUNTER — Encounter (HOSPITAL_COMMUNITY): Payer: Self-pay

## 2017-03-07 ENCOUNTER — Emergency Department (HOSPITAL_COMMUNITY)
Admission: EM | Admit: 2017-03-07 | Discharge: 2017-03-07 | Disposition: A | Discharge status: Home / Self Care | Payer: Medicare Other | Attending: Emergency Medicine | Admitting: Emergency Medicine

## 2017-03-07 DIAGNOSIS — Y658 Other specified misadventures during surgical and medical care: Secondary | ICD-10-CM | POA: Diagnosis not present

## 2017-03-07 DIAGNOSIS — T85610A Breakdown (mechanical) of epidural and subdural infusion catheter, initial encounter: Secondary | ICD-10-CM

## 2017-03-07 DIAGNOSIS — T859XXA Unspecified complication of internal prosthetic device, implant and graft, initial encounter: Secondary | ICD-10-CM | POA: Diagnosis not present

## 2017-03-07 NOTE — ED Notes (Signed)
This nurse was told that a provider called the pt back and the pt was told "the doctor you normally see is not on today, I'm not really sure why your pump is beeping but talk to the ED providers. I will speak with a pharmacist and see how much baclofen you should be taking orally as a replacement." Dr. Lonia Mad aware, Dr. Sumner Boast paged by secretary.

## 2017-03-07 NOTE — ED Triage Notes (Signed)
Patient here with baclofen pump that has been alarming today. Patient attempted to contact [facility] where placed and not able to reach anyway. He is concerned that pump is out. Took oral baclofen due to confusion on pump functioning.
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
## 2017-03-07 NOTE — ED Notes (Signed)
Per pt, pt called [facility] this AM when his pump began "beeping". Pt was told to f/u with the ED whenever his pump beeped multiple times. Pt and pt's mother report they attempted to speak with a provider at [facility] but were unable to reach anyone, while in the waiting room, pt and family were able to get in contact with [facility] and were told a provider will be calling them shortly. Dr. Lonia Mad aware, will notify when doctor calls patient so Dr. Lonia Mad may speak with the provider.

## 2017-03-07 NOTE — Discharge Instructions (Signed)
Please take 20 mg of oral baclofen 4 times a day until you are able to see her pain specialist for further management of pump failure.

## 2017-03-08 DIAGNOSIS — Z9889 Other specified postprocedural states: Secondary | ICD-10-CM | POA: Diagnosis not present

## 2017-03-08 DIAGNOSIS — Z79891 Long term (current) use of opiate analgesic: Secondary | ICD-10-CM | POA: Diagnosis not present

## 2017-03-08 DIAGNOSIS — Z5181 Encounter for therapeutic drug level monitoring: Secondary | ICD-10-CM | POA: Diagnosis not present

## 2017-03-11 DIAGNOSIS — Z79899 Other long term (current) drug therapy: Secondary | ICD-10-CM | POA: Diagnosis not present

## 2017-03-11 DIAGNOSIS — Z6826 Body mass index (BMI) 26.0-26.9, adult: Secondary | ICD-10-CM | POA: Diagnosis not present

## 2017-03-11 DIAGNOSIS — R002 Palpitations: Secondary | ICD-10-CM | POA: Diagnosis not present

## 2017-03-23 DIAGNOSIS — M9905 Segmental and somatic dysfunction of pelvic region: Secondary | ICD-10-CM | POA: Diagnosis not present

## 2017-03-23 DIAGNOSIS — S29012A Strain of muscle and tendon of back wall of thorax, initial encounter: Secondary | ICD-10-CM | POA: Diagnosis not present

## 2017-03-23 DIAGNOSIS — S338XXA Sprain of other parts of lumbar spine and pelvis, initial encounter: Secondary | ICD-10-CM | POA: Diagnosis not present

## 2017-03-23 DIAGNOSIS — S39012A Strain of muscle, fascia and tendon of lower back, initial encounter: Secondary | ICD-10-CM | POA: Diagnosis not present

## 2017-03-23 DIAGNOSIS — M9902 Segmental and somatic dysfunction of thoracic region: Secondary | ICD-10-CM | POA: Diagnosis not present

## 2017-03-23 DIAGNOSIS — M9903 Segmental and somatic dysfunction of lumbar region: Secondary | ICD-10-CM | POA: Diagnosis not present

## 2017-03-24 ENCOUNTER — Ambulatory Visit (INDEPENDENT_AMBULATORY_CARE_PROVIDER_SITE_OTHER): Payer: Medicare Other

## 2017-03-24 DIAGNOSIS — R002 Palpitations: Secondary | ICD-10-CM | POA: Diagnosis not present

## 2017-03-30 DIAGNOSIS — S39012A Strain of muscle, fascia and tendon of lower back, initial encounter: Secondary | ICD-10-CM | POA: Diagnosis not present

## 2017-03-30 DIAGNOSIS — S29012A Strain of muscle and tendon of back wall of thorax, initial encounter: Secondary | ICD-10-CM | POA: Diagnosis not present

## 2017-03-30 DIAGNOSIS — M9902 Segmental and somatic dysfunction of thoracic region: Secondary | ICD-10-CM | POA: Diagnosis not present

## 2017-03-30 DIAGNOSIS — S338XXA Sprain of other parts of lumbar spine and pelvis, initial encounter: Secondary | ICD-10-CM | POA: Diagnosis not present

## 2017-03-30 DIAGNOSIS — M9905 Segmental and somatic dysfunction of pelvic region: Secondary | ICD-10-CM | POA: Diagnosis not present

## 2017-03-30 DIAGNOSIS — M9903 Segmental and somatic dysfunction of lumbar region: Secondary | ICD-10-CM | POA: Diagnosis not present

## 2017-03-31 ENCOUNTER — Other Ambulatory Visit: Payer: Self-pay | Admitting: Cardiology

## 2017-03-31 DIAGNOSIS — R011 Cardiac murmur, unspecified: Secondary | ICD-10-CM

## 2017-03-31 DIAGNOSIS — R0989 Other specified symptoms and signs involving the circulatory and respiratory systems: Secondary | ICD-10-CM

## 2017-03-31 DIAGNOSIS — R001 Bradycardia, unspecified: Secondary | ICD-10-CM | POA: Diagnosis not present

## 2017-03-31 DIAGNOSIS — Z0189 Encounter for other specified special examinations: Secondary | ICD-10-CM | POA: Diagnosis not present

## 2017-03-31 DIAGNOSIS — G8250 Quadriplegia, unspecified: Secondary | ICD-10-CM | POA: Diagnosis not present

## 2017-04-06 DIAGNOSIS — S39012A Strain of muscle, fascia and tendon of lower back, initial encounter: Secondary | ICD-10-CM | POA: Diagnosis not present

## 2017-04-06 DIAGNOSIS — M9902 Segmental and somatic dysfunction of thoracic region: Secondary | ICD-10-CM | POA: Diagnosis not present

## 2017-04-06 DIAGNOSIS — S338XXA Sprain of other parts of lumbar spine and pelvis, initial encounter: Secondary | ICD-10-CM | POA: Diagnosis not present

## 2017-04-06 DIAGNOSIS — M9905 Segmental and somatic dysfunction of pelvic region: Secondary | ICD-10-CM | POA: Diagnosis not present

## 2017-04-06 DIAGNOSIS — M9903 Segmental and somatic dysfunction of lumbar region: Secondary | ICD-10-CM | POA: Diagnosis not present

## 2017-04-06 DIAGNOSIS — S29012A Strain of muscle and tendon of back wall of thorax, initial encounter: Secondary | ICD-10-CM | POA: Diagnosis not present

## 2017-04-13 DIAGNOSIS — S338XXA Sprain of other parts of lumbar spine and pelvis, initial encounter: Secondary | ICD-10-CM | POA: Diagnosis not present

## 2017-04-13 DIAGNOSIS — M9905 Segmental and somatic dysfunction of pelvic region: Secondary | ICD-10-CM | POA: Diagnosis not present

## 2017-04-13 DIAGNOSIS — S29012A Strain of muscle and tendon of back wall of thorax, initial encounter: Secondary | ICD-10-CM | POA: Diagnosis not present

## 2017-04-13 DIAGNOSIS — M9903 Segmental and somatic dysfunction of lumbar region: Secondary | ICD-10-CM | POA: Diagnosis not present

## 2017-04-13 DIAGNOSIS — S39012A Strain of muscle, fascia and tendon of lower back, initial encounter: Secondary | ICD-10-CM | POA: Diagnosis not present

## 2017-04-13 DIAGNOSIS — M9902 Segmental and somatic dysfunction of thoracic region: Secondary | ICD-10-CM | POA: Diagnosis not present

## 2017-04-19 ENCOUNTER — Ambulatory Visit (HOSPITAL_BASED_OUTPATIENT_CLINIC_OR_DEPARTMENT_OTHER)
Admission: RE | Admit: 2017-04-19 | Discharge: 2017-04-19 | Disposition: A | Discharge status: Home / Self Care | Payer: Medicare Other | Source: Ambulatory Visit

## 2017-04-19 ENCOUNTER — Ambulatory Visit (HOSPITAL_COMMUNITY)
Admission: RE | Admit: 2017-04-19 | Discharge: 2017-04-19 | Disposition: A | Discharge status: Home / Self Care | Payer: Medicare Other | Source: Ambulatory Visit | Attending: Internal Medicine | Admitting: Internal Medicine

## 2017-04-19 DIAGNOSIS — R011 Cardiac murmur, unspecified: Secondary | ICD-10-CM | POA: Insufficient documentation

## 2017-04-19 DIAGNOSIS — R0989 Other specified symptoms and signs involving the circulatory and respiratory systems: Secondary | ICD-10-CM

## 2017-04-19 NOTE — Progress Notes (Signed)
VASCULAR LAB PRELIMINARY  PRELIMINARY  PRELIMINARY  PRELIMINARY  Carotid duplex completed.    Preliminary report:  Bilateral - No evidence of significant extracranial ICA stenosis. Vertebral artery flow  Is antegrade.  Jomarie Gellis, RVS 04/19/2017, 11:01 AM

## 2017-04-19 NOTE — Progress Notes (Signed)
  Echocardiogram 2D Echocardiogram has been performed.  Gregory Deleon 04/19/2017, 11:55 AM

## 2017-04-20 LAB — VAS US CAROTID
LEFT ECA DIAS: -9 cm/s
LEFT VERTEBRAL DIAS: 16 cm/s
LICADDIAS: -32 cm/s
LICAPDIAS: -33 cm/s
LICAPSYS: -83 cm/s
Left CCA dist dias: -25 cm/s
Left CCA dist sys: -129 cm/s
Left CCA prox dias: 20 cm/s
Left CCA prox sys: 126 cm/s
Left ICA dist sys: -80 cm/s
RIGHT ECA DIAS: -10 cm/s
RIGHT VERTEBRAL DIAS: -10 cm/s
Right CCA prox dias: 25 cm/s
Right CCA prox sys: 151 cm/s
Right cca dist sys: -96 cm/s

## 2017-04-27 DIAGNOSIS — Z5181 Encounter for therapeutic drug level monitoring: Secondary | ICD-10-CM | POA: Diagnosis not present

## 2017-04-27 DIAGNOSIS — G959 Disease of spinal cord, unspecified: Secondary | ICD-10-CM | POA: Diagnosis not present

## 2017-04-27 DIAGNOSIS — R252 Cramp and spasm: Secondary | ICD-10-CM | POA: Diagnosis not present

## 2017-04-27 DIAGNOSIS — Z79891 Long term (current) use of opiate analgesic: Secondary | ICD-10-CM | POA: Diagnosis not present

## 2017-04-27 DIAGNOSIS — Z9889 Other specified postprocedural states: Secondary | ICD-10-CM | POA: Diagnosis not present

## 2017-04-29 DIAGNOSIS — M9905 Segmental and somatic dysfunction of pelvic region: Secondary | ICD-10-CM | POA: Diagnosis not present

## 2017-04-29 DIAGNOSIS — S39012A Strain of muscle, fascia and tendon of lower back, initial encounter: Secondary | ICD-10-CM | POA: Diagnosis not present

## 2017-04-29 DIAGNOSIS — S29012A Strain of muscle and tendon of back wall of thorax, initial encounter: Secondary | ICD-10-CM | POA: Diagnosis not present

## 2017-04-29 DIAGNOSIS — S338XXA Sprain of other parts of lumbar spine and pelvis, initial encounter: Secondary | ICD-10-CM | POA: Diagnosis not present

## 2017-04-29 DIAGNOSIS — M9902 Segmental and somatic dysfunction of thoracic region: Secondary | ICD-10-CM | POA: Diagnosis not present

## 2017-04-29 DIAGNOSIS — M9903 Segmental and somatic dysfunction of lumbar region: Secondary | ICD-10-CM | POA: Diagnosis not present

## 2017-05-03 DIAGNOSIS — Z0189 Encounter for other specified special examinations: Secondary | ICD-10-CM | POA: Diagnosis not present

## 2017-05-03 DIAGNOSIS — R001 Bradycardia, unspecified: Secondary | ICD-10-CM | POA: Diagnosis not present

## 2017-05-03 DIAGNOSIS — G8250 Quadriplegia, unspecified: Secondary | ICD-10-CM | POA: Diagnosis not present

## 2017-05-03 DIAGNOSIS — R011 Cardiac murmur, unspecified: Secondary | ICD-10-CM | POA: Diagnosis not present

## 2017-05-07 DIAGNOSIS — N312 Flaccid neuropathic bladder, not elsewhere classified: Secondary | ICD-10-CM | POA: Diagnosis not present

## 2017-05-27 DIAGNOSIS — S338XXA Sprain of other parts of lumbar spine and pelvis, initial encounter: Secondary | ICD-10-CM | POA: Diagnosis not present

## 2017-05-27 DIAGNOSIS — S29012A Strain of muscle and tendon of back wall of thorax, initial encounter: Secondary | ICD-10-CM | POA: Diagnosis not present

## 2017-05-27 DIAGNOSIS — M9905 Segmental and somatic dysfunction of pelvic region: Secondary | ICD-10-CM | POA: Diagnosis not present

## 2017-05-27 DIAGNOSIS — S39012A Strain of muscle, fascia and tendon of lower back, initial encounter: Secondary | ICD-10-CM | POA: Diagnosis not present

## 2017-05-27 DIAGNOSIS — M9903 Segmental and somatic dysfunction of lumbar region: Secondary | ICD-10-CM | POA: Diagnosis not present

## 2017-05-27 DIAGNOSIS — M9902 Segmental and somatic dysfunction of thoracic region: Secondary | ICD-10-CM | POA: Diagnosis not present

## 2017-06-23 DIAGNOSIS — F418 Other specified anxiety disorders: Secondary | ICD-10-CM | POA: Diagnosis not present

## 2017-06-23 DIAGNOSIS — Z0000 Encounter for general adult medical examination without abnormal findings: Secondary | ICD-10-CM | POA: Diagnosis not present

## 2017-06-30 DIAGNOSIS — G894 Chronic pain syndrome: Secondary | ICD-10-CM | POA: Diagnosis not present

## 2017-06-30 DIAGNOSIS — Z1389 Encounter for screening for other disorder: Secondary | ICD-10-CM | POA: Diagnosis not present

## 2017-06-30 DIAGNOSIS — M318 Other specified necrotizing vasculopathies: Secondary | ICD-10-CM | POA: Diagnosis not present

## 2017-06-30 DIAGNOSIS — R338 Other retention of urine: Secondary | ICD-10-CM | POA: Diagnosis not present

## 2017-06-30 DIAGNOSIS — Z23 Encounter for immunization: Secondary | ICD-10-CM | POA: Diagnosis not present

## 2017-06-30 DIAGNOSIS — G8250 Quadriplegia, unspecified: Secondary | ICD-10-CM | POA: Diagnosis not present

## 2017-06-30 DIAGNOSIS — K5909 Other constipation: Secondary | ICD-10-CM | POA: Diagnosis not present

## 2017-06-30 DIAGNOSIS — Z6826 Body mass index (BMI) 26.0-26.9, adult: Secondary | ICD-10-CM | POA: Diagnosis not present

## 2017-06-30 DIAGNOSIS — R002 Palpitations: Secondary | ICD-10-CM | POA: Diagnosis not present

## 2017-06-30 DIAGNOSIS — Z0000 Encounter for general adult medical examination without abnormal findings: Secondary | ICD-10-CM | POA: Diagnosis not present

## 2017-08-09 DIAGNOSIS — S39012A Strain of muscle, fascia and tendon of lower back, initial encounter: Secondary | ICD-10-CM | POA: Diagnosis not present

## 2017-08-09 DIAGNOSIS — M9902 Segmental and somatic dysfunction of thoracic region: Secondary | ICD-10-CM | POA: Diagnosis not present

## 2017-08-09 DIAGNOSIS — M9905 Segmental and somatic dysfunction of pelvic region: Secondary | ICD-10-CM | POA: Diagnosis not present

## 2017-08-09 DIAGNOSIS — S338XXA Sprain of other parts of lumbar spine and pelvis, initial encounter: Secondary | ICD-10-CM | POA: Diagnosis not present

## 2017-08-09 DIAGNOSIS — M9903 Segmental and somatic dysfunction of lumbar region: Secondary | ICD-10-CM | POA: Diagnosis not present

## 2017-08-09 DIAGNOSIS — S29012A Strain of muscle and tendon of back wall of thorax, initial encounter: Secondary | ICD-10-CM | POA: Diagnosis not present

## 2017-09-20 DIAGNOSIS — S39012A Strain of muscle, fascia and tendon of lower back, initial encounter: Secondary | ICD-10-CM | POA: Diagnosis not present

## 2017-09-20 DIAGNOSIS — S338XXA Sprain of other parts of lumbar spine and pelvis, initial encounter: Secondary | ICD-10-CM | POA: Diagnosis not present

## 2017-09-20 DIAGNOSIS — M9902 Segmental and somatic dysfunction of thoracic region: Secondary | ICD-10-CM | POA: Diagnosis not present

## 2017-09-20 DIAGNOSIS — M9905 Segmental and somatic dysfunction of pelvic region: Secondary | ICD-10-CM | POA: Diagnosis not present

## 2017-09-20 DIAGNOSIS — M9903 Segmental and somatic dysfunction of lumbar region: Secondary | ICD-10-CM | POA: Diagnosis not present

## 2017-09-20 DIAGNOSIS — S29012A Strain of muscle and tendon of back wall of thorax, initial encounter: Secondary | ICD-10-CM | POA: Diagnosis not present

## 2017-10-26 DIAGNOSIS — Z5181 Encounter for therapeutic drug level monitoring: Secondary | ICD-10-CM | POA: Diagnosis not present

## 2017-10-26 DIAGNOSIS — R252 Cramp and spasm: Secondary | ICD-10-CM | POA: Diagnosis not present

## 2017-10-26 DIAGNOSIS — Z9689 Presence of other specified functional implants: Secondary | ICD-10-CM | POA: Diagnosis not present

## 2017-10-26 DIAGNOSIS — Z79891 Long term (current) use of opiate analgesic: Secondary | ICD-10-CM | POA: Diagnosis not present

## 2017-10-26 DIAGNOSIS — M5001 Cervical disc disorder with myelopathy,  high cervical region: Secondary | ICD-10-CM | POA: Diagnosis not present

## 2017-11-30 DIAGNOSIS — M25532 Pain in left wrist: Secondary | ICD-10-CM | POA: Diagnosis not present

## 2017-11-30 DIAGNOSIS — G8250 Quadriplegia, unspecified: Secondary | ICD-10-CM | POA: Diagnosis not present

## 2018-04-26 DIAGNOSIS — G959 Disease of spinal cord, unspecified: Secondary | ICD-10-CM | POA: Diagnosis not present

## 2018-04-26 DIAGNOSIS — Z79891 Long term (current) use of opiate analgesic: Secondary | ICD-10-CM | POA: Diagnosis not present

## 2018-04-26 DIAGNOSIS — S12430S Unspecified traumatic displaced spondylolisthesis of fifth cervical vertebra, sequela: Secondary | ICD-10-CM | POA: Diagnosis not present

## 2018-04-26 DIAGNOSIS — Z9689 Presence of other specified functional implants: Secondary | ICD-10-CM | POA: Diagnosis not present

## 2018-04-26 DIAGNOSIS — Z451 Encounter for adjustment and management of infusion pump: Secondary | ICD-10-CM | POA: Diagnosis not present

## 2018-04-26 DIAGNOSIS — M792 Neuralgia and neuritis, unspecified: Secondary | ICD-10-CM | POA: Diagnosis not present

## 2018-04-26 DIAGNOSIS — Z993 Dependence on wheelchair: Secondary | ICD-10-CM | POA: Diagnosis not present

## 2018-04-26 DIAGNOSIS — M50022 Cervical disc disorder at C5-C6 level with myelopathy: Secondary | ICD-10-CM | POA: Diagnosis not present

## 2018-04-26 DIAGNOSIS — S14126S Central cord syndrome at C6 level of cervical spinal cord, sequela: Secondary | ICD-10-CM | POA: Diagnosis not present

## 2018-04-26 DIAGNOSIS — G8929 Other chronic pain: Secondary | ICD-10-CM | POA: Diagnosis not present

## 2018-04-26 DIAGNOSIS — R252 Cramp and spasm: Secondary | ICD-10-CM | POA: Diagnosis not present

## 2018-04-26 DIAGNOSIS — M79642 Pain in left hand: Secondary | ICD-10-CM | POA: Diagnosis not present

## 2018-06-29 DIAGNOSIS — Z0000 Encounter for general adult medical examination without abnormal findings: Secondary | ICD-10-CM | POA: Diagnosis not present

## 2018-07-06 DIAGNOSIS — K5909 Other constipation: Secondary | ICD-10-CM | POA: Diagnosis not present

## 2018-07-06 DIAGNOSIS — N319 Neuromuscular dysfunction of bladder, unspecified: Secondary | ICD-10-CM | POA: Diagnosis not present

## 2018-07-06 DIAGNOSIS — G8250 Quadriplegia, unspecified: Secondary | ICD-10-CM | POA: Diagnosis not present

## 2018-07-06 DIAGNOSIS — Z1389 Encounter for screening for other disorder: Secondary | ICD-10-CM | POA: Diagnosis not present

## 2018-07-06 DIAGNOSIS — Z0000 Encounter for general adult medical examination without abnormal findings: Secondary | ICD-10-CM | POA: Diagnosis not present

## 2018-07-06 DIAGNOSIS — G894 Chronic pain syndrome: Secondary | ICD-10-CM | POA: Diagnosis not present

## 2018-07-06 DIAGNOSIS — Z23 Encounter for immunization: Secondary | ICD-10-CM | POA: Diagnosis not present

## 2018-07-06 DIAGNOSIS — R339 Retention of urine, unspecified: Secondary | ICD-10-CM | POA: Diagnosis not present

## 2018-07-07 DIAGNOSIS — N312 Flaccid neuropathic bladder, not elsewhere classified: Secondary | ICD-10-CM | POA: Diagnosis not present

## 2018-10-11 DIAGNOSIS — S14129A Central cord syndrome at unspecified level of cervical spinal cord, initial encounter: Secondary | ICD-10-CM | POA: Diagnosis not present

## 2018-10-11 DIAGNOSIS — G8921 Chronic pain due to trauma: Secondary | ICD-10-CM | POA: Diagnosis not present

## 2018-10-11 DIAGNOSIS — G9589 Other specified diseases of spinal cord: Secondary | ICD-10-CM | POA: Diagnosis not present

## 2018-10-11 DIAGNOSIS — Z978 Presence of other specified devices: Secondary | ICD-10-CM | POA: Diagnosis not present

## 2018-10-11 DIAGNOSIS — M792 Neuralgia and neuritis, unspecified: Secondary | ICD-10-CM | POA: Diagnosis not present

## 2018-10-11 DIAGNOSIS — R252 Cramp and spasm: Secondary | ICD-10-CM | POA: Diagnosis not present

## 2018-10-11 DIAGNOSIS — Z451 Encounter for adjustment and management of infusion pump: Secondary | ICD-10-CM | POA: Diagnosis not present

## 2018-10-11 DIAGNOSIS — S14125D Central cord syndrome at C5 level of cervical spinal cord, subsequent encounter: Secondary | ICD-10-CM | POA: Diagnosis not present

## 2018-11-16 DIAGNOSIS — N312 Flaccid neuropathic bladder, not elsewhere classified: Secondary | ICD-10-CM | POA: Diagnosis not present

## 2018-11-16 DIAGNOSIS — N3000 Acute cystitis without hematuria: Secondary | ICD-10-CM | POA: Diagnosis not present

## 2018-12-27 DIAGNOSIS — N312 Flaccid neuropathic bladder, not elsewhere classified: Secondary | ICD-10-CM | POA: Diagnosis not present

## 2018-12-27 DIAGNOSIS — N3000 Acute cystitis without hematuria: Secondary | ICD-10-CM | POA: Diagnosis not present

## 2019-01-10 DIAGNOSIS — G9589 Other specified diseases of spinal cord: Secondary | ICD-10-CM | POA: Diagnosis not present

## 2019-01-10 DIAGNOSIS — S14125D Central cord syndrome at C5 level of cervical spinal cord, subsequent encounter: Secondary | ICD-10-CM | POA: Diagnosis not present

## 2019-01-10 DIAGNOSIS — S14126D Central cord syndrome at C6 level of cervical spinal cord, subsequent encounter: Secondary | ICD-10-CM | POA: Diagnosis not present

## 2019-02-13 ENCOUNTER — Ambulatory Visit: Payer: Medicare Other | Admitting: Physical Therapy

## 2019-02-13 DIAGNOSIS — G8254 Quadriplegia, C5-C7 incomplete: Secondary | ICD-10-CM | POA: Diagnosis not present

## 2019-02-23 DIAGNOSIS — Z7409 Other reduced mobility: Secondary | ICD-10-CM | POA: Diagnosis not present

## 2019-02-23 DIAGNOSIS — G8250 Quadriplegia, unspecified: Secondary | ICD-10-CM | POA: Diagnosis not present

## 2019-02-23 DIAGNOSIS — Z1331 Encounter for screening for depression: Secondary | ICD-10-CM | POA: Diagnosis not present

## 2019-03-13 ENCOUNTER — Ambulatory Visit: Payer: Medicare Other | Admitting: Physical Therapy

## 2019-04-11 DIAGNOSIS — S14129A Central cord syndrome at unspecified level of cervical spinal cord, initial encounter: Secondary | ICD-10-CM | POA: Diagnosis not present

## 2019-04-11 DIAGNOSIS — G8929 Other chronic pain: Secondary | ICD-10-CM | POA: Diagnosis not present

## 2019-04-11 DIAGNOSIS — Z978 Presence of other specified devices: Secondary | ICD-10-CM | POA: Diagnosis not present

## 2019-04-11 DIAGNOSIS — R252 Cramp and spasm: Secondary | ICD-10-CM | POA: Diagnosis not present

## 2019-04-11 DIAGNOSIS — Z451 Encounter for adjustment and management of infusion pump: Secondary | ICD-10-CM | POA: Diagnosis not present

## 2019-04-11 DIAGNOSIS — G629 Polyneuropathy, unspecified: Secondary | ICD-10-CM | POA: Diagnosis not present

## 2019-04-11 DIAGNOSIS — G9589 Other specified diseases of spinal cord: Secondary | ICD-10-CM | POA: Diagnosis not present

## 2019-04-25 DIAGNOSIS — N139 Obstructive and reflux uropathy, unspecified: Secondary | ICD-10-CM | POA: Diagnosis not present

## 2019-04-25 DIAGNOSIS — N312 Flaccid neuropathic bladder, not elsewhere classified: Secondary | ICD-10-CM | POA: Diagnosis not present

## 2019-04-25 DIAGNOSIS — N5201 Erectile dysfunction due to arterial insufficiency: Secondary | ICD-10-CM | POA: Diagnosis not present

## 2019-07-04 DIAGNOSIS — Z23 Encounter for immunization: Secondary | ICD-10-CM | POA: Diagnosis not present

## 2019-07-04 DIAGNOSIS — R7989 Other specified abnormal findings of blood chemistry: Secondary | ICD-10-CM | POA: Diagnosis not present

## 2019-07-04 DIAGNOSIS — R82998 Other abnormal findings in urine: Secondary | ICD-10-CM | POA: Diagnosis not present

## 2019-07-11 DIAGNOSIS — R339 Retention of urine, unspecified: Secondary | ICD-10-CM | POA: Diagnosis not present

## 2019-07-11 DIAGNOSIS — Z0000 Encounter for general adult medical examination without abnormal findings: Secondary | ICD-10-CM | POA: Diagnosis not present

## 2019-07-11 DIAGNOSIS — G8250 Quadriplegia, unspecified: Secondary | ICD-10-CM | POA: Diagnosis not present

## 2019-07-11 DIAGNOSIS — Z1331 Encounter for screening for depression: Secondary | ICD-10-CM | POA: Diagnosis not present

## 2019-07-11 DIAGNOSIS — N319 Neuromuscular dysfunction of bladder, unspecified: Secondary | ICD-10-CM | POA: Diagnosis not present

## 2019-07-11 DIAGNOSIS — Z1339 Encounter for screening examination for other mental health and behavioral disorders: Secondary | ICD-10-CM | POA: Diagnosis not present

## 2019-07-11 DIAGNOSIS — G894 Chronic pain syndrome: Secondary | ICD-10-CM | POA: Diagnosis not present

## 2019-07-11 DIAGNOSIS — K5909 Other constipation: Secondary | ICD-10-CM | POA: Diagnosis not present

## 2019-09-21 DIAGNOSIS — S14155A Other incomplete lesion at C5 level of cervical spinal cord, initial encounter: Secondary | ICD-10-CM | POA: Diagnosis not present

## 2019-09-21 DIAGNOSIS — Z978 Presence of other specified devices: Secondary | ICD-10-CM | POA: Diagnosis not present

## 2019-09-21 DIAGNOSIS — Z451 Encounter for adjustment and management of infusion pump: Secondary | ICD-10-CM | POA: Diagnosis not present

## 2019-09-21 DIAGNOSIS — G959 Disease of spinal cord, unspecified: Secondary | ICD-10-CM | POA: Diagnosis not present

## 2019-09-21 DIAGNOSIS — S14156A Other incomplete lesion at C6 level of cervical spinal cord, initial encounter: Secondary | ICD-10-CM | POA: Diagnosis not present

## 2019-09-21 DIAGNOSIS — R252 Cramp and spasm: Secondary | ICD-10-CM | POA: Diagnosis not present

## 2019-09-21 DIAGNOSIS — M792 Neuralgia and neuritis, unspecified: Secondary | ICD-10-CM | POA: Diagnosis not present

## 2020-02-18 ENCOUNTER — Emergency Department (HOSPITAL_COMMUNITY)
Admission: EM | Admit: 2020-02-18 | Discharge: 2020-02-18 | Disposition: A | Discharge status: Home / Self Care | Payer: Commercial Managed Care - PPO | Attending: Emergency Medicine | Admitting: Emergency Medicine

## 2020-02-18 ENCOUNTER — Emergency Department (HOSPITAL_COMMUNITY): Payer: Commercial Managed Care - PPO

## 2020-02-18 ENCOUNTER — Other Ambulatory Visit: Payer: Self-pay

## 2020-02-18 ENCOUNTER — Encounter (HOSPITAL_COMMUNITY): Payer: Self-pay | Admitting: Emergency Medicine

## 2020-02-18 DIAGNOSIS — F17210 Nicotine dependence, cigarettes, uncomplicated: Secondary | ICD-10-CM | POA: Insufficient documentation

## 2020-02-18 DIAGNOSIS — N453 Epididymo-orchitis: Secondary | ICD-10-CM | POA: Diagnosis not present

## 2020-02-18 DIAGNOSIS — N5089 Other specified disorders of the male genital organs: Secondary | ICD-10-CM | POA: Diagnosis not present

## 2020-02-18 DIAGNOSIS — Z79899 Other long term (current) drug therapy: Secondary | ICD-10-CM | POA: Diagnosis not present

## 2020-02-18 DIAGNOSIS — N50812 Left testicular pain: Secondary | ICD-10-CM | POA: Insufficient documentation

## 2020-02-18 LAB — CBC WITH DIFFERENTIAL/PLATELET
Abs Immature Granulocytes: 0.02 10*3/uL (ref 0.00–0.07)
Basophils Absolute: 0 10*3/uL (ref 0.0–0.1)
Basophils Relative: 0 %
Eosinophils Absolute: 0.1 10*3/uL (ref 0.0–0.5)
Eosinophils Relative: 1 %
HCT: 41.4 % (ref 39.0–52.0)
Hemoglobin: 13.7 g/dL (ref 13.0–17.0)
Immature Granulocytes: 0 %
Lymphocytes Relative: 17 %
Lymphs Abs: 1.4 10*3/uL (ref 0.7–4.0)
MCH: 28.8 pg (ref 26.0–34.0)
MCHC: 33.1 g/dL (ref 30.0–36.0)
MCV: 87.2 fL (ref 80.0–100.0)
Monocytes Absolute: 0.8 10*3/uL (ref 0.1–1.0)
Monocytes Relative: 10 %
Neutro Abs: 6.4 10*3/uL (ref 1.7–7.7)
Neutrophils Relative %: 72 %
Platelets: 165 10*3/uL (ref 150–400)
RBC: 4.75 MIL/uL (ref 4.22–5.81)
RDW: 12.7 % (ref 11.5–15.5)
WBC: 8.7 10*3/uL (ref 4.0–10.5)
nRBC: 0 % (ref 0.0–0.2)

## 2020-02-18 LAB — BASIC METABOLIC PANEL
Anion gap: 10 (ref 5–15)
BUN: 7 mg/dL (ref 6–20)
CO2: 24 mmol/L (ref 22–32)
Calcium: 8.9 mg/dL (ref 8.9–10.3)
Chloride: 106 mmol/L (ref 98–111)
Creatinine, Ser: 0.59 mg/dL — ABNORMAL LOW (ref 0.61–1.24)
GFR calc Af Amer: >60 mL/min (ref >60)
GFR calc non Af Amer: >60 mL/min (ref >60)
Glucose, Bld: 95 mg/dL (ref 70–99)
Potassium: 3.4 mmol/L — ABNORMAL LOW (ref 3.5–5.1)
Sodium: 140 mmol/L (ref 135–145)

## 2020-02-18 LAB — URINALYSIS, ROUTINE W REFLEX MICROSCOPIC
Bacteria, UA: NONE SEEN
Bilirubin Urine: NEGATIVE
Glucose, UA: NEGATIVE mg/dL
Hgb urine dipstick: NEGATIVE
Ketones, ur: 80 mg/dL — AB
Nitrite: NEGATIVE
Protein, ur: NEGATIVE mg/dL
Specific Gravity, Urine: 1.023 (ref 1.005–1.030)
pH: 5 (ref 5.0–8.0)

## 2020-02-18 IMAGING — US US SCROTUM W/ DOPPLER COMPLETE
1 series · 13 of 25 positions shown · non-contrast
Comparison: None.

CLINICAL DATA: Left testicular swelling and redness for 2 days

EXAM:
SCROTAL ULTRASOUND
DOPPLER ULTRASOUND OF THE TESTICLES
TECHNIQUE: Complete ultrasound examination of the testicles, epididymis, and
other scrotal structures was performed. Color and spectral Doppler
ultrasound were also utilized to evaluate blood flow to the
testicles.

[Series 1: us scrotum w/doppler · 13 of 75 slices shown]
[im 1/75]
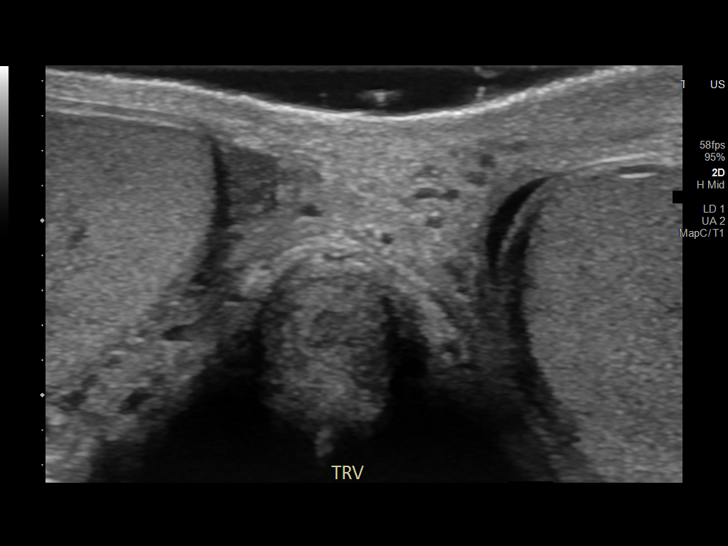
[im 7/75]
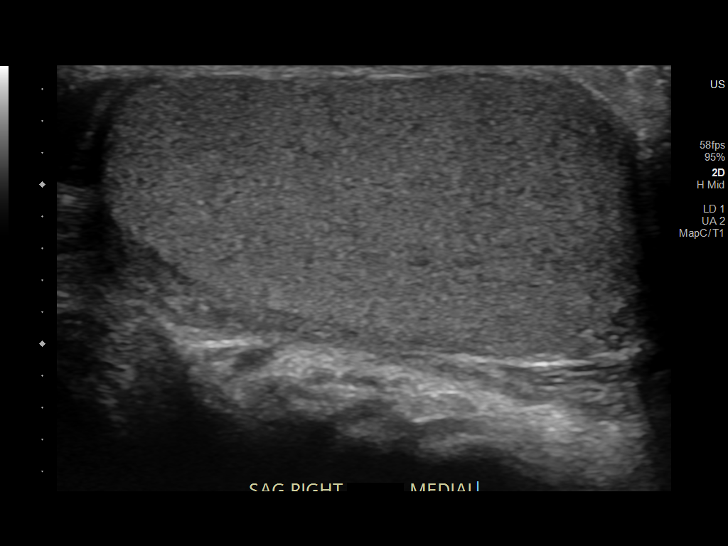
[im 13/75]
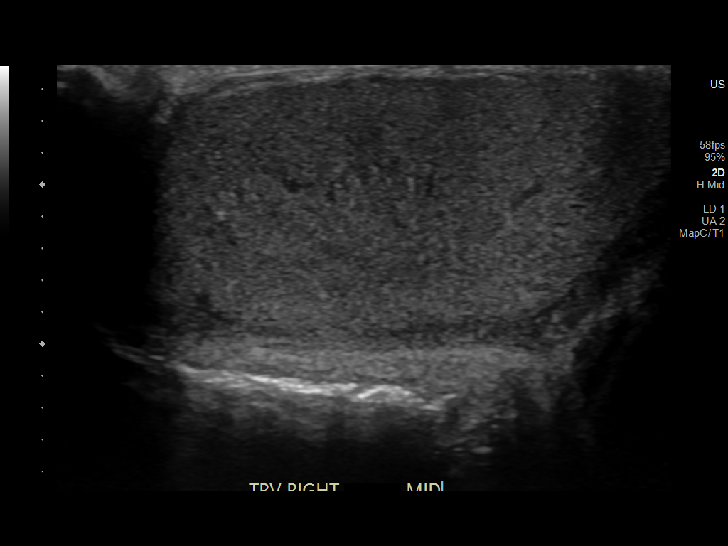
[im 19/75]
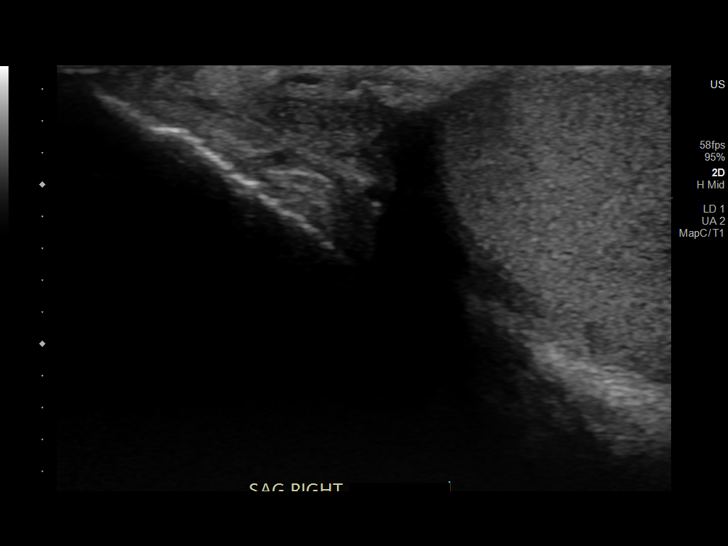
[im 25/75]
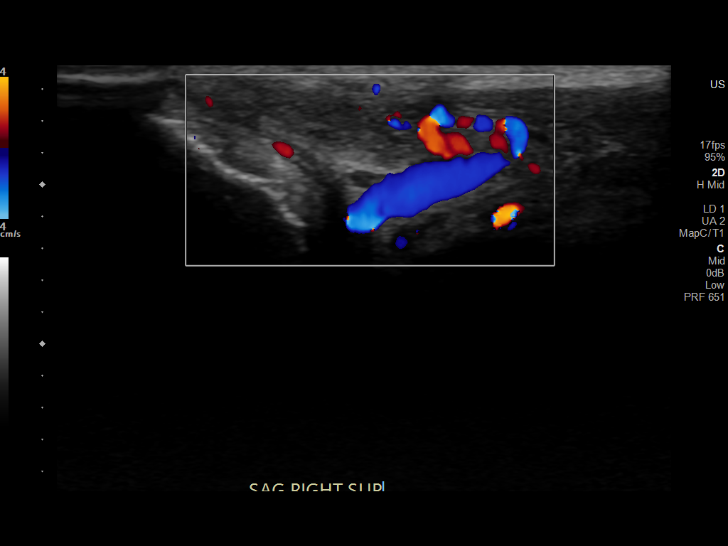
[im 31/75]
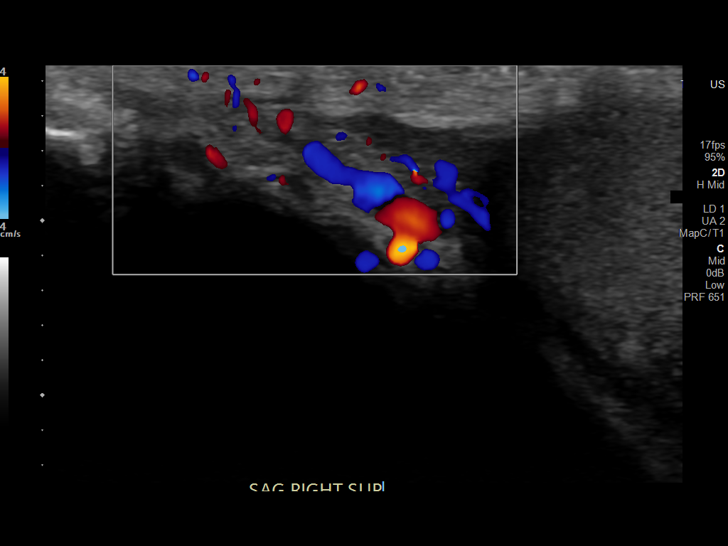
[im 38/75]
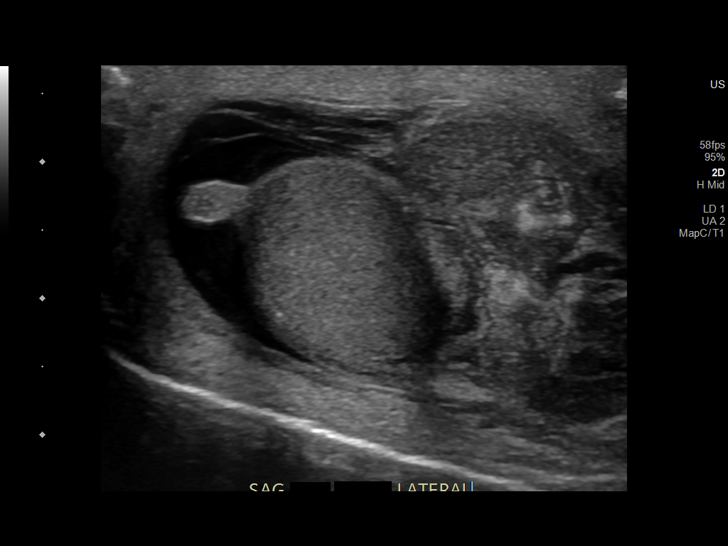
[im 44/75]
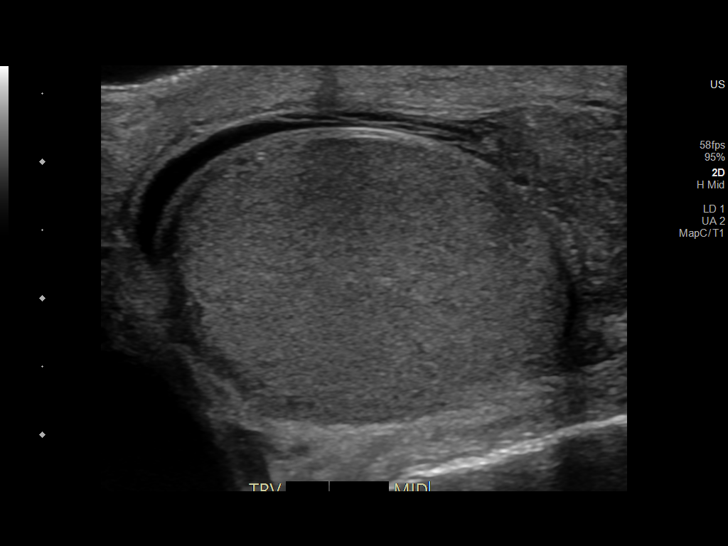
[im 50/75]
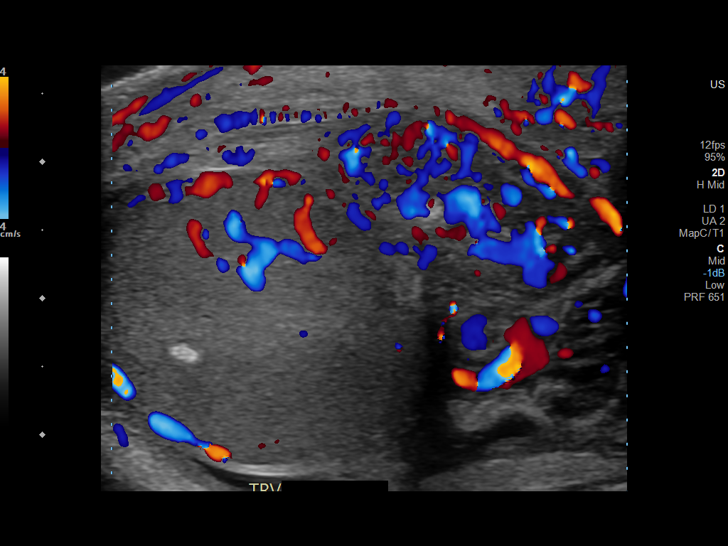
[im 56/75]
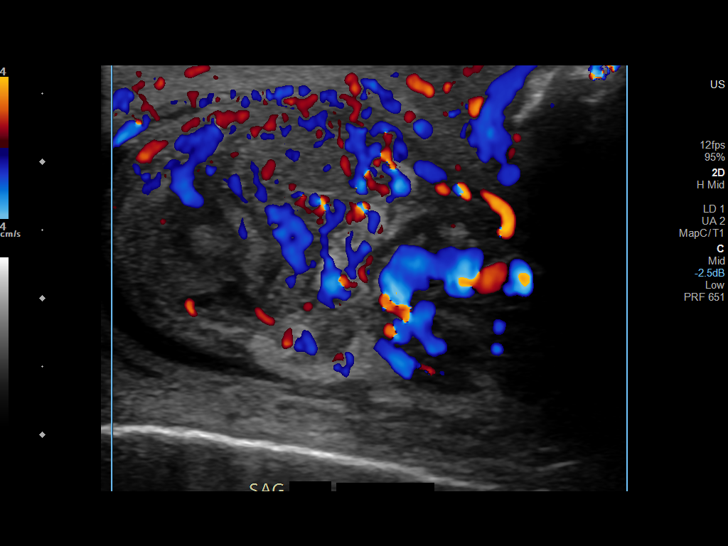
[im 62/75]
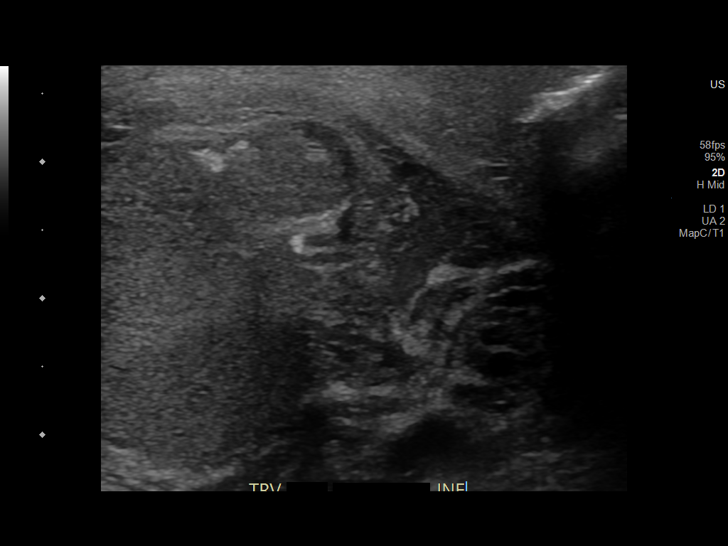
[im 68/75]
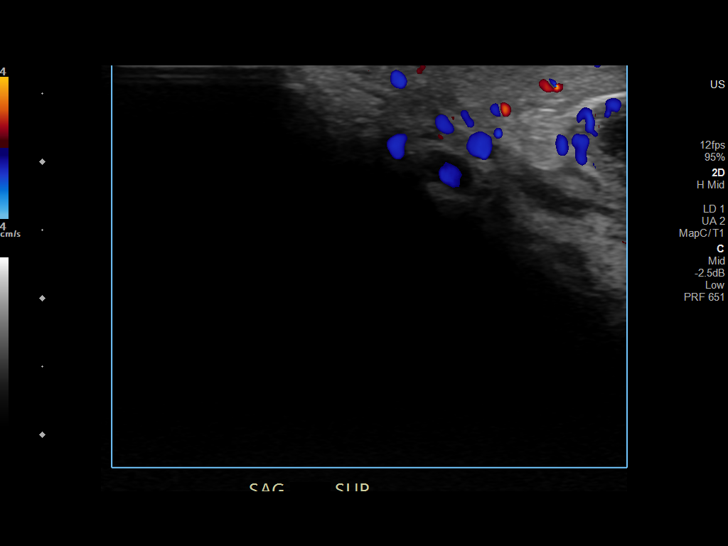
[im 75/75]
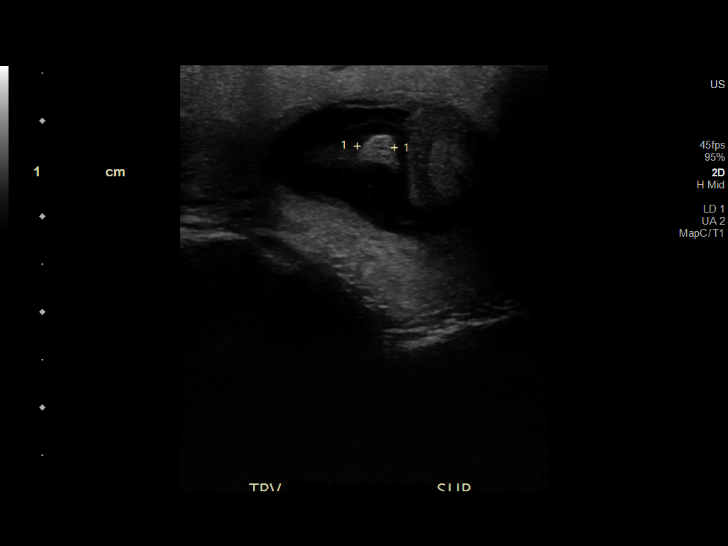

[13 of 25 positions shown; findings below may reference images not displayed]

FINDINGS: Right testicle

Measurements: 3.6 x 1.7 x 3.3 cm.  Microlithiasis, no focal mass.

Left testicle

Measurements: 3.1 x 2.1 x 2.9 cm. Microlithiasis with signs of
generalized hyperemia more so in the left epididymis. Scrotal
thickening over the left scrotum.

Right epididymis:  Normal in size and appearance.

Left epididymis: Markedly heterogeneous and hyperemic. 3.5 x 1.9 x
1.4 cm.

Hydrocele:  Small hydrocele on the left.

Varicocele: None visualized though the patient was unable to
Valsalva

Pulsed Doppler interrogation of both testes demonstrates normal low
resistance arterial and venous waveforms bilaterally.
IMPRESSION: 1. Epididymo-orchitis of the left testicle with more profound
changes in the epididymis rather than the testicle as described.
Associated with small left hydrocele.
2. Testicular microlithiasis. Current literature suggests that
testicular microlithiasis is not a significant independent risk
factor for development of testicular carcinoma, and that follow up
imaging is not warranted in the absence of other risk factors.
Monthly testicular self-examination and annual physical exams are
considered appropriate surveillance. If patient has other risk
factors for testicular carcinoma, then referral to Urology should be
considered. (Reference: ELBA ENID, et al.: A 5-Year Follow up Study
of Asymptomatic Men with Testicular Microlithiasis. J Urol [H8];

## 2020-02-18 MED ORDER — CEFTRIAXONE SODIUM 500 MG IJ SOLR
500.0000 mg | Freq: Once | INTRAMUSCULAR | Status: AC
  Administered 2020-02-18: 500 mg via INTRAMUSCULAR
  Filled 2020-02-18: qty 500

## 2020-02-18 MED ORDER — DOXYCYCLINE HYCLATE 100 MG PO CAPS: 100.0000 mg | ORAL_CAPSULE | Freq: Two times a day (BID) | ORAL | 0 refills | Status: AC

## 2020-02-18 MED ORDER — DOXYCYCLINE HYCLATE 100 MG PO TABS
100.0000 mg | ORAL_TABLET | Freq: Once | ORAL | Status: AC
  Administered 2020-02-18: 100 mg via ORAL
  Filled 2020-02-18: qty 1

## 2020-02-18 NOTE — Discharge Instructions (Signed)
You have been diagnosed today with Epididymo-orchitis, Testicular Microlithiasis.  At this time there does not appear to be the presence of an emergent medical condition, however there is always the potential for conditions to change. Please read and follow the below instructions.  Please return to the Emergency Department immediately for any new or worsening symptoms or if your symptoms do not improve within 3 days. Please be sure to follow up with your Primary Care Provider within one week regarding your visit today; please call their office to schedule an appointment even if you are feeling better for a follow-up visit. Please continue taking the antibiotic doxycycline as prescribed for the next 10 days for treatment of infection. Be sure to drink enough water.  Elevation of your scrotum may help with pain.  Please call the urologist Dr. Chaya Jan office today to schedule a follow-up appointment for further evaluation. Additionally your ultrasound today showed testicular microlithiasis, discussed this with your urologist and primary care provider at your follow-up appointment. You have been tested today for gonorrhea and chlamydia. These results will be available in approximately 3 days. You may check your MyChart account for results. Please inform all sexual partners of positive results and that they should be tested and treated as well. Please wait 2 weeks and be sure that you and your partners are symptom free before returning to sexual activity. Please use protection with every sexual encounter. Follow Up: Please followup with your primary doctor in 3 days for discussion of your diagnoses and further evaluation after today's visit; if you do not have a primary care doctor use the resource guide provided to find one; Please return to the ER for worsening symptoms, high fevers or persistent vomiting.   Get help right away if: You have a fever. Your pain is getting worse. You have abdominal pain or  nausea/vomiting. You have any new/concerning or worsening of symptoms  Please read the additional information packets attached to your discharge summary.  Do not take your medicine if  develop an itchy rash, swelling in your mouth or lips, or difficulty breathing; call 911 and seek immediate emergency medical attention if this occurs.  Note: Portions of this text may have been transcribed using voice recognition software. Every effort was made to ensure accuracy; however, inadvertent computerized transcription errors may still be present.

## 2020-02-18 NOTE — ED Provider Notes (Signed)
Care handoff received from Britini Henderly PA-C at shift change, please see her note for full details of visit.  In short 29 year old male with history of C5-C6 injury who self catheterizes presents for 3-day history of left testicular pain swelling and redness.  He has taken several doses of Macrobid which he takes for chronic UTI without relief of symptoms.  CBC, BMP, urinalysis and ultrasound of the scrotum were ordered by previous provider.  Plan of care is to follow-up on labs and imaging.  Possible CT scan if ultrasound unrevealing. Physical Exam  BP 126/66 (BP Location: Right Arm)   Pulse 65   Temp 98.4 F (36.9 C) (Oral)   Resp 18   Ht 5\' 7"  (1.702 m)   Wt 77.1 kg   SpO2 97%   BMI 26.63 kg/m   Physical Exam Constitutional:      General: He is not in acute distress.    Appearance: Normal appearance. He is well-developed. He is not ill-appearing or diaphoretic.  HENT:     Head: Normocephalic and atraumatic.     Right Ear: External ear normal.     Left Ear: External ear normal.     Nose: Nose normal.  Eyes:     General: Vision grossly intact. Gaze aligned appropriately.     Pupils: Pupils are equal, round, and reactive to light.  Neck:     Trachea: Trachea and phonation normal. No tracheal deviation.  Pulmonary:     Effort: Pulmonary effort is normal. No respiratory distress.  Abdominal:     General: There is no distension.     Palpations: Abdomen is soft.     Tenderness: There is no abdominal tenderness. There is no guarding or rebound.  Genitourinary:    Comments: Examination chaperoned by nurse tech.  No external genital lesions noted, no bumps on head of penis, specifically no vesicles concerning for herpes or chancre suggestive of syphilis.  No pain with palpation of the penis/glans, no discharge or urethritis noted.  Mild erythema of the left scrotum, tenderness to palpation of the left epididymis and testicle.  No fluctuance, induration or skin  break. Cremasteric reflex intact bilaterally. No palpable hernia noted.  Musculoskeletal:        General: Normal range of motion.     Cervical back: Normal range of motion.  Skin:    General: Skin is warm and dry.  Neurological:     Mental Status: He is alert.     GCS: GCS eye subscore is 4. GCS verbal subscore is 5. GCS motor subscore is 6.     Comments: Speech is clear and goal oriented, follows commands Major Cranial nerves without deficit, no facial droop Moves extremities without ataxia, coordination intact  Psychiatric:        Behavior: Behavior normal.     ED Course/Procedures   Clinical Course as of Feb 18 1724  Sun Feb 18, 2020  1707 Haley NT.   [BM]  1716 Rocephin, Doxycycline,    [BM]    Clinical Course User Index [BM] 1717, PA-C    Procedures  MDM  I have personally reviewed and interpreted the following labs.  CBC is within normal limits, no evidence of anemia and no leukocytosis.  BMP shows minimal low potassium of 3.4, no emergent electrolyte derangement, evidence of kidney injury or gap.  Urinalysis shows 80 ketones, small leukocytes, 21-50 WBCs which is suggestive of degree of dehydration and infection.  Bill Salinas Scrotum w/ Doppler:  IMPRESSION:  1. Epididymo-orchitis of the left testicle with more profound  changes in the epididymis rather than the testicle as described.  Associated with small left hydrocele.  2. Testicular microlithiasis. Current literature suggests that  testicular microlithiasis is not a significant independent risk  factor for development of testicular carcinoma, and that follow up  imaging is not warranted in the absence of other risk factors.  Monthly testicular self-examination and annual physical exams are  considered appropriate surveillance. If patient has other risk  factors for testicular carcinoma, then referral to Urology should be  considered. (Reference: DeCastro, et al.: A 5-Year Follow up Study  of  Asymptomatic Men with Testicular Microlithiasis. J Urol 2008;  570:1779-3903.)  - On reassessment patient is resting comfortably no acute distress reports that he is feeling well.  He reports that he is sexually active with his girlfriend and only intermittently uses protection.  He reports that his girlfriend has been tested recently for STI and was negative.  High suspicion for STI etiology of patient's epididymoorchitis today, will treat with Rocephin 500 mg intramuscular here in the ED and start patient on doxycycline 100 mg twice daily x10 days.  Will refer patient to urologist office for follow-up appointment.  Incidental findings noted and patient to follow-up with urology as well.  There is no evidence of abscess, patient's vital signs are stable he appears appropriate for outpatient treatment.  Patient is aware of GC/chlamydia urine sample pending and will check his results in the next 2-3 days.  He declined HIV/RPR testing.  Patient aware to have all sexual partners tested and treated and to await completion of treatment before returning to sexual activity.  Patient informed to use protection with every sexual encounter. - Of note patient reports that he does not take Isotretinoin.  At this time there does not appear to be any evidence of an acute emergency medical condition and the patient appears stable for discharge with appropriate outpatient follow up. Diagnosis was discussed with patient who verbalizes understanding of care plan and is agreeable to discharge. I have discussed return precautions with patient who verbalizes understanding of return precautions. Patient encouraged to follow-up with their PCP and urology. All questions answered.  Patient's case discussed with Dr. Rex Kras who agrees with plan to discharge with follow-up.   Note: Portions of this report may have been transcribed using voice recognition software. Every effort was made to ensure accuracy; however, inadvertent  computerized transcription errors may still be present.    Gari Crown 02/18/20 1754    Little, Wenda Overland, MD 02/22/20 [phone]

## 2020-02-18 NOTE — ED Notes (Signed)
Pt verbalizes understanding of d/c instructions. Prescriptions reviewed with patient. Pt with personal wheelchair to lobby at d/c with all belongings.

## 2020-02-18 NOTE — ED Triage Notes (Signed)
C/o L testicle pain and swelling since Friday.

## 2020-02-18 NOTE — ED Provider Notes (Signed)
[facility] EMERGENCY DEPARTMENT Provider Note   CSN: [id number] Arrival date & time: 02/18/20  1427    History Chief Complaint  Patient presents with  . Testicle Pain    Gregory Deleon is a 29 y.o. male with past medical history significant for spinal cord injury at C5-C6, self caths who presents for evaluation of testicular pain.  Patient states on Friday he noticed some left-sided testicular pain.  States he thought he had a UTI and started taking Macrobid.  States he self caths 6-7 times daily.  He noticed today he started having some swelling and some redness to the left side of his testicle.  It is now tender to palpation.  Denies any difficulties with bowel movements, abdominal pain, flank pain, fever, chills, nausea, vomiting.  Denies aggravating or alleviating factors.  History obtained from patient and past medical records.  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 Spinal cord injury, C5-C7 (HCC)    incomplete    Patient Active Problem List   Diagnosis Date Noted  . Palpitations 03/24/2017    History reviewed. No pertinent surgical history.     No family history on file.  Social History   Tobacco Use  . Smoking status: Current Some Day Smoker  . Smokeless tobacco: Never Used  Substance Use Topics  . Alcohol use: Yes    Alcohol/week: 0.0 standard drinks  . Drug use: Not Currently    Home Medications Prior to Admission medications   Medication Sig Start Date End Date Taking? Authorizing Provider  amitriptyline (ELAVIL) 25 MG tablet Take 25 mg by mouth at bedtime. Reported on 02/03/2016    [provider]  Ascorbic Acid (VITAMIN C PO) Take by mouth. Reported on 02/03/2016    [provider]  baclofen (LIORESAL) 10 MG tablet Take 10 mg by mouth 3 (three) times daily.     [provider]  Bisacodyl (DULCOLAX PO) Take by mouth. Reported on 02/03/2016    [provider]  Calcium Polycarbophil (FIBERCON PO) Take by mouth. Reported on 02/03/2016    [provider]  cephALEXin (KEFLEX) 500 MG capsule Take 1 capsule (500 mg total) by mouth 3 (three) times daily. Patient not taking: Reported on 12/25/2015 09/19/14   Lenn Sink, DPM  DULoxetine (CYMBALTA) 20 MG capsule Take 20 mg by mouth daily. Reported on 02/03/2016    [provider]  ISOtretinoin (ABSORICA) 40 MG capsule Take 40 mg by mouth 2 (two) times daily. Reported on 02/03/2016    [provider]  levofloxacin (LEVAQUIN) 500 MG tablet Take 1 tablet (500 mg total) by mouth daily. Patient not taking: Reported on 12/25/2015 09/24/14   Lenn Sink, DPM  levofloxacin (LEVAQUIN) 500 MG tablet Take 1 tablet (500 mg total) by mouth daily. Patient not taking: Reported on 12/25/2015 10/01/14   Lenn Sink, DPM  meloxicam (MOBIC) 15 MG tablet Take 15 mg by mouth daily. Reported on 02/03/2016    [provider]  Naloxegol Oxalate (MOVANTIK PO) Take by mouth. Reported on 02/03/2016    [provider]  OXcarbazepine (TRILEPTAL) 150 MG tablet Take 150 mg by mouth 2 (two) times daily. Reported on 02/03/2016    [provider]  OXYBUTYNIN CHLORIDE PO Take by mouth.    [provider]  oxycodone (OXY-IR) 5  MG capsule Take 5 mg by mouth every 4 (four) hours as needed.    [provider]  pregabalin (LYRICA) 150 MG capsule Take 150 mg by mouth 2 (two) times daily. Reported on 12/25/2015    [provider]  sulfamethoxazole-trimethoprim (BACTRIM DS,SEPTRA DS) 800-160 MG per tablet Take 1 tablet by mouth 2 (two) times daily. Patient not taking: Reported on 12/25/2015 09/24/14   Wallene Huh, DPM  Tapentadol HCl (NUCYNTA PO) Take by mouth. Reported on 02/03/2016    [provider]    Allergies    Lemon extract [flavoring  agent]  Review of Systems   Review of Systems  Constitutional: Negative.   HENT: Negative.   Eyes: Negative.   Respiratory: Negative.   Cardiovascular: Negative.   Gastrointestinal: Negative.   Genitourinary: Positive for scrotal swelling and testicular pain. Negative for difficulty urinating, discharge, dysuria, frequency, genital sores, hematuria, penile pain, penile swelling and urgency.  Musculoskeletal: Negative.   Neurological: Negative.   All other systems reviewed and are negative.   Physical Exam Updated Vital Signs BP 126/66 (BP Location: Right Arm)   Pulse 65   Temp 98.4 F (36.9 C) (Oral)   Resp 18   Ht 5\' 7"  (1.702 m)   Wt 77.1 kg   SpO2 97%   BMI 26.63 kg/m   Physical Exam Vitals and nursing note reviewed. Exam conducted with a chaperone present.  Constitutional:      General: He is not in acute distress.    Appearance: He is well-developed. He is not ill-appearing, toxic-appearing or diaphoretic.  HENT:     Head: Normocephalic and atraumatic.     Nose: Nose normal.     Mouth/Throat:     Mouth: Mucous membranes are moist.     Pharynx: Oropharynx is clear.  Eyes:     Pupils: Pupils are equal, round, and reactive to light.  Cardiovascular:     Rate and Rhythm: Normal rate and regular rhythm.     Pulses: Normal pulses.     Heart sounds: Normal heart sounds.  Pulmonary:     Effort: Pulmonary effort is normal. No respiratory distress.     Breath sounds: Normal breath sounds.  Abdominal:     General: Bowel sounds are normal. There is no distension.     Palpations: Abdomen is soft.     Tenderness: There is no abdominal tenderness. There is no right CVA tenderness, left CVA tenderness, guarding or rebound.  Genitourinary:    Pubic Area: No rash or pubic lice.      Penis: Normal. No phimosis, paraphimosis, hypospadias, erythema, tenderness, discharge, swelling or lesions.      Testes:        Right: Mass, tenderness, swelling, testicular hydrocele or  varicocele not present. Right testis is descended. Cremasteric reflex is present.         Left: Tenderness and swelling present.     Epididymis:     Left: Tenderness present.     Comments: Erythema and tenderness to left testes.  Mild left testicular swelling.  No penile drainage, swelling, redness.  No rashes or lesions Musculoskeletal:        General: Normal range of motion.     Cervical back: Normal range of motion and neck supple.  Skin:    General: Skin is warm and dry.     Capillary Refill: Capillary refill takes less than 2 seconds.     Comments: Erythema and tenderness to left testes  Neurological:  Mental Status: He is alert.    ED Results / Procedures / Treatments   Labs (all labs ordered are listed, but only abnormal results are displayed) Labs Reviewed  CBC WITH DIFFERENTIAL/PLATELET  BASIC METABOLIC PANEL  URINALYSIS, ROUTINE W REFLEX MICROSCOPIC    EKG None  Radiology No results found.  Procedures Procedures (including critical care time)  Medications Ordered in ED Medications - No data to display  ED Course  I have reviewed the triage vital signs and the nursing notes.  Pertinent labs & imaging results that were available during my care of the patient were reviewed by me and considered in my medical decision making (see chart for details).  29 year old male presents for evaluation of 3-day history of left testicular redness and swelling.  Patient C5-C6 paralysis he self catheterizes 6-7 times daily.  Initially noted pain which he thought was a UTI which he has chronic history of and started taking Macrobid without relief.  Denies any hematuria.  Abdomen soft without rebound or guarding.  Seem to have some degree of cellulitis on exam. Plan on labs, imaging and reassess  Care transferred to [place], [place].  Plan id ultrasound does not show any significant findings would likely get CT scan pelvis with contrast to r/o abscess. Would plan to at least treat for  cellulitis.   MDM Rules/Calculators/A&P                       Final Clinical Impression(s) / ED Diagnoses Final diagnoses:  Pain in left testicle    Rx / DC Orders ED Discharge Orders    None       Deondray Ospina A, PA-C 02/18/20 1555    Curatolo, Adam, DO 02/22/20 0745

## 2020-02-18 NOTE — ED Notes (Signed)
Pt transported to [facility]

## 2020-02-19 LAB — GC/CHLAMYDIA PROBE AMP (~~LOC~~) NOT AT ARMC
Chlamydia: NEGATIVE
Comment: NEGATIVE
Comment: NORMAL
Neisseria Gonorrhea: NEGATIVE

## 2020-02-20 LAB — URINE CULTURE

## 2021-08-11 DIAGNOSIS — G8250 Quadriplegia, unspecified: Secondary | ICD-10-CM | POA: Diagnosis not present

## 2021-08-11 DIAGNOSIS — Z4549 Encounter for adjustment and management of other implanted nervous system device: Secondary | ICD-10-CM | POA: Diagnosis not present

## 2021-08-11 DIAGNOSIS — M62838 Other muscle spasm: Secondary | ICD-10-CM | POA: Diagnosis not present

## 2021-08-11 DIAGNOSIS — S14159S Other incomplete lesion at unspecified level of cervical spinal cord, sequela: Secondary | ICD-10-CM | POA: Diagnosis not present

## 2021-08-11 DIAGNOSIS — X58XXXS Exposure to other specified factors, sequela: Secondary | ICD-10-CM | POA: Diagnosis not present

## 2021-08-11 DIAGNOSIS — T85695A Other mechanical complication of other nervous system device, implant or graft, initial encounter: Secondary | ICD-10-CM | POA: Diagnosis not present

## 2021-08-11 DIAGNOSIS — Z4589 Encounter for adjustment and management of other implanted devices: Secondary | ICD-10-CM | POA: Diagnosis not present

## 2021-08-11 DIAGNOSIS — Z978 Presence of other specified devices: Secondary | ICD-10-CM | POA: Diagnosis not present

## 2021-08-11 DIAGNOSIS — Z87891 Personal history of nicotine dependence: Secondary | ICD-10-CM | POA: Diagnosis not present

## 2021-08-25 DIAGNOSIS — G959 Disease of spinal cord, unspecified: Secondary | ICD-10-CM | POA: Diagnosis not present

## 2021-08-25 DIAGNOSIS — X58XXXA Exposure to other specified factors, initial encounter: Secondary | ICD-10-CM | POA: Diagnosis not present

## 2021-08-25 DIAGNOSIS — Z978 Presence of other specified devices: Secondary | ICD-10-CM | POA: Diagnosis not present

## 2021-08-25 DIAGNOSIS — Z9689 Presence of other specified functional implants: Secondary | ICD-10-CM | POA: Diagnosis not present

## 2021-08-25 DIAGNOSIS — R252 Cramp and spasm: Secondary | ICD-10-CM | POA: Diagnosis not present

## 2021-08-25 DIAGNOSIS — M792 Neuralgia and neuritis, unspecified: Secondary | ICD-10-CM | POA: Diagnosis not present

## 2021-08-25 DIAGNOSIS — S14129A Central cord syndrome at unspecified level of cervical spinal cord, initial encounter: Secondary | ICD-10-CM | POA: Diagnosis not present

## 2021-08-25 DIAGNOSIS — S12400A Unspecified displaced fracture of fifth cervical vertebra, initial encounter for closed fracture: Secondary | ICD-10-CM | POA: Diagnosis not present

## 2021-10-01 DIAGNOSIS — N319 Neuromuscular dysfunction of bladder, unspecified: Secondary | ICD-10-CM | POA: Diagnosis not present

## 2021-10-01 DIAGNOSIS — R339 Retention of urine, unspecified: Secondary | ICD-10-CM | POA: Diagnosis not present

## 2021-10-31 DIAGNOSIS — R339 Retention of urine, unspecified: Secondary | ICD-10-CM | POA: Diagnosis not present

## 2021-10-31 DIAGNOSIS — N319 Neuromuscular dysfunction of bladder, unspecified: Secondary | ICD-10-CM | POA: Diagnosis not present

## 2021-12-02 DIAGNOSIS — N319 Neuromuscular dysfunction of bladder, unspecified: Secondary | ICD-10-CM | POA: Diagnosis not present

## 2021-12-02 DIAGNOSIS — R339 Retention of urine, unspecified: Secondary | ICD-10-CM | POA: Diagnosis not present

## 2022-01-06 DIAGNOSIS — N319 Neuromuscular dysfunction of bladder, unspecified: Secondary | ICD-10-CM | POA: Diagnosis not present

## 2022-01-06 DIAGNOSIS — R339 Retention of urine, unspecified: Secondary | ICD-10-CM | POA: Diagnosis not present

## 2022-02-19 DIAGNOSIS — X58XXXA Exposure to other specified factors, initial encounter: Secondary | ICD-10-CM | POA: Diagnosis not present

## 2022-02-19 DIAGNOSIS — M792 Neuralgia and neuritis, unspecified: Secondary | ICD-10-CM | POA: Diagnosis not present

## 2022-02-19 DIAGNOSIS — R252 Cramp and spasm: Secondary | ICD-10-CM | POA: Diagnosis not present

## 2022-02-19 DIAGNOSIS — Z978 Presence of other specified devices: Secondary | ICD-10-CM | POA: Diagnosis not present

## 2022-02-19 DIAGNOSIS — S14129A Central cord syndrome at unspecified level of cervical spinal cord, initial encounter: Secondary | ICD-10-CM | POA: Diagnosis not present

## 2022-04-22 DIAGNOSIS — G8254 Quadriplegia, C5-C7 incomplete: Secondary | ICD-10-CM | POA: Diagnosis not present

## 2022-04-22 DIAGNOSIS — G90512 Complex regional pain syndrome I of left upper limb: Secondary | ICD-10-CM | POA: Diagnosis not present

## 2022-04-28 DIAGNOSIS — R339 Retention of urine, unspecified: Secondary | ICD-10-CM | POA: Diagnosis not present

## 2022-04-28 DIAGNOSIS — N319 Neuromuscular dysfunction of bladder, unspecified: Secondary | ICD-10-CM | POA: Diagnosis not present

## 2022-05-28 DIAGNOSIS — N319 Neuromuscular dysfunction of bladder, unspecified: Secondary | ICD-10-CM | POA: Diagnosis not present

## 2022-05-28 DIAGNOSIS — R339 Retention of urine, unspecified: Secondary | ICD-10-CM | POA: Diagnosis not present

## 2022-06-29 DIAGNOSIS — R339 Retention of urine, unspecified: Secondary | ICD-10-CM | POA: Diagnosis not present

## 2022-06-29 DIAGNOSIS — N319 Neuromuscular dysfunction of bladder, unspecified: Secondary | ICD-10-CM | POA: Diagnosis not present

## 2022-07-30 DIAGNOSIS — R252 Cramp and spasm: Secondary | ICD-10-CM | POA: Diagnosis not present

## 2022-07-30 DIAGNOSIS — Z978 Presence of other specified devices: Secondary | ICD-10-CM | POA: Diagnosis not present

## 2022-07-30 DIAGNOSIS — M792 Neuralgia and neuritis, unspecified: Secondary | ICD-10-CM | POA: Diagnosis not present

## 2022-08-24 DIAGNOSIS — R7989 Other specified abnormal findings of blood chemistry: Secondary | ICD-10-CM | POA: Diagnosis not present

## 2022-08-24 DIAGNOSIS — Z79899 Other long term (current) drug therapy: Secondary | ICD-10-CM | POA: Diagnosis not present

## 2022-08-31 DIAGNOSIS — G8250 Quadriplegia, unspecified: Secondary | ICD-10-CM | POA: Diagnosis not present

## 2022-08-31 DIAGNOSIS — Z0000 Encounter for general adult medical examination without abnormal findings: Secondary | ICD-10-CM | POA: Diagnosis not present

## 2022-08-31 DIAGNOSIS — Z1331 Encounter for screening for depression: Secondary | ICD-10-CM | POA: Diagnosis not present

## 2022-08-31 DIAGNOSIS — Z1339 Encounter for screening examination for other mental health and behavioral disorders: Secondary | ICD-10-CM | POA: Diagnosis not present

## 2022-08-31 DIAGNOSIS — Z23 Encounter for immunization: Secondary | ICD-10-CM | POA: Diagnosis not present

## 2022-09-18 DIAGNOSIS — R339 Retention of urine, unspecified: Secondary | ICD-10-CM | POA: Diagnosis not present

## 2022-09-18 DIAGNOSIS — N319 Neuromuscular dysfunction of bladder, unspecified: Secondary | ICD-10-CM | POA: Diagnosis not present

## 2022-10-19 DIAGNOSIS — R339 Retention of urine, unspecified: Secondary | ICD-10-CM | POA: Diagnosis not present

## 2022-10-19 DIAGNOSIS — N319 Neuromuscular dysfunction of bladder, unspecified: Secondary | ICD-10-CM | POA: Diagnosis not present

## 2023-01-07 DIAGNOSIS — N319 Neuromuscular dysfunction of bladder, unspecified: Secondary | ICD-10-CM | POA: Diagnosis not present

## 2023-01-07 DIAGNOSIS — R339 Retention of urine, unspecified: Secondary | ICD-10-CM | POA: Diagnosis not present

## 2023-02-04 DIAGNOSIS — S14126D Central cord syndrome at C6 level of cervical spinal cord, subsequent encounter: Secondary | ICD-10-CM | POA: Diagnosis not present

## 2023-02-04 DIAGNOSIS — R252 Cramp and spasm: Secondary | ICD-10-CM | POA: Diagnosis not present

## 2023-02-04 DIAGNOSIS — Z79899 Other long term (current) drug therapy: Secondary | ICD-10-CM | POA: Diagnosis not present

## 2023-02-04 DIAGNOSIS — Z978 Presence of other specified devices: Secondary | ICD-10-CM | POA: Diagnosis not present

## 2023-02-04 DIAGNOSIS — S14129A Central cord syndrome at unspecified level of cervical spinal cord, initial encounter: Secondary | ICD-10-CM | POA: Diagnosis not present

## 2023-02-04 DIAGNOSIS — M792 Neuralgia and neuritis, unspecified: Secondary | ICD-10-CM | POA: Diagnosis not present

## 2023-02-10 DIAGNOSIS — N319 Neuromuscular dysfunction of bladder, unspecified: Secondary | ICD-10-CM | POA: Diagnosis not present

## 2023-02-10 DIAGNOSIS — R339 Retention of urine, unspecified: Secondary | ICD-10-CM | POA: Diagnosis not present

## 2023-03-11 DIAGNOSIS — N319 Neuromuscular dysfunction of bladder, unspecified: Secondary | ICD-10-CM | POA: Diagnosis not present

## 2023-03-11 DIAGNOSIS — R339 Retention of urine, unspecified: Secondary | ICD-10-CM | POA: Diagnosis not present

## 2023-05-06 DIAGNOSIS — N319 Neuromuscular dysfunction of bladder, unspecified: Secondary | ICD-10-CM | POA: Diagnosis not present

## 2023-05-06 DIAGNOSIS — R339 Retention of urine, unspecified: Secondary | ICD-10-CM | POA: Diagnosis not present

## 2023-05-27 DIAGNOSIS — N312 Flaccid neuropathic bladder, not elsewhere classified: Secondary | ICD-10-CM | POA: Diagnosis not present

## 2023-05-27 DIAGNOSIS — N139 Obstructive and reflux uropathy, unspecified: Secondary | ICD-10-CM | POA: Diagnosis not present

## 2023-05-27 DIAGNOSIS — N5201 Erectile dysfunction due to arterial insufficiency: Secondary | ICD-10-CM | POA: Diagnosis not present

## 2023-06-03 DIAGNOSIS — N312 Flaccid neuropathic bladder, not elsewhere classified: Secondary | ICD-10-CM | POA: Diagnosis not present

## 2023-06-07 DIAGNOSIS — R339 Retention of urine, unspecified: Secondary | ICD-10-CM | POA: Diagnosis not present

## 2023-06-07 DIAGNOSIS — N319 Neuromuscular dysfunction of bladder, unspecified: Secondary | ICD-10-CM | POA: Diagnosis not present

## 2023-07-09 DIAGNOSIS — R339 Retention of urine, unspecified: Secondary | ICD-10-CM | POA: Diagnosis not present

## 2023-07-09 DIAGNOSIS — N319 Neuromuscular dysfunction of bladder, unspecified: Secondary | ICD-10-CM | POA: Diagnosis not present

## 2023-07-29 DIAGNOSIS — Z978 Presence of other specified devices: Secondary | ICD-10-CM | POA: Diagnosis not present

## 2023-07-29 DIAGNOSIS — Z451 Encounter for adjustment and management of infusion pump: Secondary | ICD-10-CM | POA: Diagnosis not present

## 2023-07-29 DIAGNOSIS — S14129A Central cord syndrome at unspecified level of cervical spinal cord, initial encounter: Secondary | ICD-10-CM | POA: Diagnosis not present

## 2023-07-29 DIAGNOSIS — R252 Cramp and spasm: Secondary | ICD-10-CM | POA: Diagnosis not present

## 2023-09-01 DIAGNOSIS — R339 Retention of urine, unspecified: Secondary | ICD-10-CM | POA: Diagnosis not present

## 2023-09-01 DIAGNOSIS — N319 Neuromuscular dysfunction of bladder, unspecified: Secondary | ICD-10-CM | POA: Diagnosis not present

## 2023-09-10 DIAGNOSIS — Z1389 Encounter for screening for other disorder: Secondary | ICD-10-CM | POA: Diagnosis not present

## 2023-09-17 DIAGNOSIS — Z1331 Encounter for screening for depression: Secondary | ICD-10-CM | POA: Diagnosis not present

## 2023-09-17 DIAGNOSIS — Z0000 Encounter for general adult medical examination without abnormal findings: Secondary | ICD-10-CM | POA: Diagnosis not present

## 2023-09-17 DIAGNOSIS — Z23 Encounter for immunization: Secondary | ICD-10-CM | POA: Diagnosis not present

## 2023-09-17 DIAGNOSIS — G8250 Quadriplegia, unspecified: Secondary | ICD-10-CM | POA: Diagnosis not present

## 2023-09-17 DIAGNOSIS — Z1339 Encounter for screening examination for other mental health and behavioral disorders: Secondary | ICD-10-CM | POA: Diagnosis not present

## 2023-12-30 DIAGNOSIS — N319 Neuromuscular dysfunction of bladder, unspecified: Secondary | ICD-10-CM | POA: Diagnosis not present

## 2023-12-30 DIAGNOSIS — R339 Retention of urine, unspecified: Secondary | ICD-10-CM | POA: Diagnosis not present

## 2024-01-20 DIAGNOSIS — R252 Cramp and spasm: Secondary | ICD-10-CM | POA: Diagnosis not present

## 2024-01-20 DIAGNOSIS — Z978 Presence of other specified devices: Secondary | ICD-10-CM | POA: Diagnosis not present

## 2024-01-20 DIAGNOSIS — S14129A Central cord syndrome at unspecified level of cervical spinal cord, initial encounter: Secondary | ICD-10-CM | POA: Diagnosis not present

## 2024-01-20 DIAGNOSIS — S14129D Central cord syndrome at unspecified level of cervical spinal cord, subsequent encounter: Secondary | ICD-10-CM | POA: Diagnosis not present

## 2024-01-20 DIAGNOSIS — X58XXXA Exposure to other specified factors, initial encounter: Secondary | ICD-10-CM | POA: Diagnosis not present

## 2024-01-31 DIAGNOSIS — N319 Neuromuscular dysfunction of bladder, unspecified: Secondary | ICD-10-CM | POA: Diagnosis not present

## 2024-01-31 DIAGNOSIS — R339 Retention of urine, unspecified: Secondary | ICD-10-CM | POA: Diagnosis not present

## 2024-03-09 DIAGNOSIS — R339 Retention of urine, unspecified: Secondary | ICD-10-CM | POA: Diagnosis not present

## 2024-03-09 DIAGNOSIS — N319 Neuromuscular dysfunction of bladder, unspecified: Secondary | ICD-10-CM | POA: Diagnosis not present

## 2024-04-10 DIAGNOSIS — N319 Neuromuscular dysfunction of bladder, unspecified: Secondary | ICD-10-CM | POA: Diagnosis not present

## 2024-04-10 DIAGNOSIS — R339 Retention of urine, unspecified: Secondary | ICD-10-CM | POA: Diagnosis not present

## 2024-05-10 DIAGNOSIS — R339 Retention of urine, unspecified: Secondary | ICD-10-CM | POA: Diagnosis not present

## 2024-05-10 DIAGNOSIS — N319 Neuromuscular dysfunction of bladder, unspecified: Secondary | ICD-10-CM | POA: Diagnosis not present

## 2024-06-01 DIAGNOSIS — N312 Flaccid neuropathic bladder, not elsewhere classified: Secondary | ICD-10-CM | POA: Diagnosis not present

## 2024-06-01 DIAGNOSIS — N139 Obstructive and reflux uropathy, unspecified: Secondary | ICD-10-CM | POA: Diagnosis not present

## 2024-06-01 DIAGNOSIS — N5201 Erectile dysfunction due to arterial insufficiency: Secondary | ICD-10-CM | POA: Diagnosis not present

## 2024-06-14 DIAGNOSIS — N319 Neuromuscular dysfunction of bladder, unspecified: Secondary | ICD-10-CM | POA: Diagnosis not present

## 2024-06-14 DIAGNOSIS — R339 Retention of urine, unspecified: Secondary | ICD-10-CM | POA: Diagnosis not present

## 2024-07-13 DIAGNOSIS — R252 Cramp and spasm: Secondary | ICD-10-CM | POA: Diagnosis not present

## 2024-07-13 DIAGNOSIS — Z978 Presence of other specified devices: Secondary | ICD-10-CM | POA: Diagnosis not present

## 2024-07-18 DIAGNOSIS — G8250 Quadriplegia, unspecified: Secondary | ICD-10-CM | POA: Diagnosis not present

## 2024-07-25 DIAGNOSIS — N319 Neuromuscular dysfunction of bladder, unspecified: Secondary | ICD-10-CM | POA: Diagnosis not present

## 2024-07-25 DIAGNOSIS — R339 Retention of urine, unspecified: Secondary | ICD-10-CM | POA: Diagnosis not present

## 2024-09-22 DIAGNOSIS — N319 Neuromuscular dysfunction of bladder, unspecified: Secondary | ICD-10-CM | POA: Diagnosis not present
